# Patient Record
Sex: Female | Born: 1995 | Race: Black or African American | Hispanic: No | Marital: Single | Smoking: Never smoker
Health system: Southern US, Community
[De-identification: ages and names within clinical notes are randomized; demographics above are authoritative.]

## PROBLEM LIST (undated history)

## (undated) ENCOUNTER — Emergency Department: Admission: EM | Payer: Managed Care, Other (non HMO)

## (undated) DIAGNOSIS — T7840XA Allergy, unspecified, initial encounter: Secondary | ICD-10-CM

## (undated) DIAGNOSIS — I10 Essential (primary) hypertension: Secondary | ICD-10-CM

## (undated) DIAGNOSIS — A64 Unspecified sexually transmitted disease: Secondary | ICD-10-CM

## (undated) DIAGNOSIS — E119 Type 2 diabetes mellitus without complications: Secondary | ICD-10-CM

## (undated) DIAGNOSIS — B009 Herpesviral infection, unspecified: Secondary | ICD-10-CM

## (undated) HISTORY — PX: INCISE AND DRAIN ABCESS: PRO64

## (undated) HISTORY — PX: COLON SURGERY: SHX602

## (undated) HISTORY — DX: Unspecified sexually transmitted disease: A64

## (undated) HISTORY — DX: Herpesviral infection, unspecified: B00.9

## (undated) HISTORY — DX: Essential (primary) hypertension: I10

## (undated) HISTORY — DX: Type 2 diabetes mellitus without complications: E11.9

## (undated) HISTORY — DX: Allergy, unspecified, initial encounter: T78.40XA

---
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
## 2014-01-12 ENCOUNTER — Encounter: Payer: Self-pay | Admitting: Internal Medicine

## 2014-01-12 LAB — HM DIABETES EYE EXAM

## 2014-02-11 ENCOUNTER — Other Ambulatory Visit: Payer: Self-pay | Admitting: Internal Medicine

## 2014-02-12 ENCOUNTER — Other Ambulatory Visit: Payer: Self-pay | Admitting: Internal Medicine

## 2014-05-13 ENCOUNTER — Other Ambulatory Visit: Payer: Self-pay | Admitting: Internal Medicine

## 2014-12-09 ENCOUNTER — Ambulatory Visit (INDEPENDENT_AMBULATORY_CARE_PROVIDER_SITE_OTHER): Payer: BLUE CROSS/BLUE SHIELD | Admitting: Internal Medicine

## 2014-12-09 ENCOUNTER — Encounter: Payer: Self-pay | Admitting: Internal Medicine

## 2014-12-09 VITALS — BP 122/80 | HR 99 | Temp 98.6°F | Resp 12 | Wt 123.8 lb

## 2014-12-09 DIAGNOSIS — E109 Type 1 diabetes mellitus without complications: Secondary | ICD-10-CM | POA: Diagnosis not present

## 2014-12-09 NOTE — Progress Notes (Signed)
Patient ID: Brittany Dixon, female   DOB: [birth year], 19 y.o.   MRN: [id number]  HPI: Brittany Dixon is a 19 y.o.-year-old female returning for f/u for DM1, dx 2004 (19 y/o) uncontrolled, without complications. PCP - [facility] - [place], [place]. Last visit 2 mo ago.  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 She is not interested to restart on a pump.  Last hemoglobin A1c was: Lab Results  Component Value Date   HGBA1C 11.9* 10/05/2013  - 01/2013: 11.4% She was seeing Dr Griffin Dakin (?) - [place] for DM management as a child.   She is on: - Lantus 28 units at bedtime - pens - NovoLog - pens - takes it after she eats  ICR 1:7.5  target 125  ISF 50 She ends up injecting: 15-18 units per meal - injects long after a meal! Injects in abdomen mostly.  Pt checks her sugars 2-3x a day and they are: - am: 80-130 >> 80-120 - 2h after b'fast: n/c - before lunch: 225-250 >> 170-280 (180s if compliant with b'fast insulin) - 2h after lunch: n/c - takes NovoLog at 4 pm!, then goes to class; does not check sugars then, does not calculate carbs - before dinner (7 pm): 200-250 >> 120s if takes insulin with lunch; 300s if  if skips insulin with lunch - 2h after dinner: n/c - bedtime: 180-320 >> n/c - nighttime: n/c No lows. Lowest sugar was 64;  she has hypoglycemia awareness at 70. One previous hypoglycemia admission - in middle school. Has a glucagon kit at home - had to use it once, in 2002. Highest sugar was 423 >> HI. Had previous DKA admissions - last in 6th grade  Pt's meals are: - Breakfast: grits, eggs, sausage and bowl of fruit - in cafeteria - Lunch: hot dog or cereal + milk - Dinner: sandwich + greens - Snacks: no Drinks water or tea - sweet.   - no CKD, last BUN/creatinine: Lab Results  Component Value Date   BUN 10 10/05/2013   CREATININE 0.8 10/05/2013   MICROALBUMIN / CREATININE URINE RATIO      Result Value Ref Range   Microalb, Ur  1.0  0.0 - 1.9 mg/dL   Creatinine,U 112.8     Microalb Creat Ratio 0.9  0.0 - 30.0 mg/g   - lipids:  No results found for: CHOL, HDL, LDLCALC, LDLDIRECT, TRIG, CHOLHDL  - last eye exam was a year ago. No DR.   - no numbness and tingling in her feet.  Last TSH:  Lab Results  Component Value Date   TSH 0.90 10/05/2013   Started OCPs.  ROS: Constitutional: no weight gain/loss, no fatigue, no subjective hyperthermia/hypothermia Eyes: no blurry vision, no xerophthalmia ENT: no sore throat, no nodules palpated in throat, no dysphagia/odynophagia, no hoarseness Cardiovascular: no CP/SOB/palpitations/leg swelling Respiratory: no cough/SOB Gastrointestinal: no N/V/D/C Musculoskeletal: no muscle/joint aches Skin: no rashes Neurological: no tremors/numbness/tingling/dizziness  I reviewed pt's medications, allergies, PMH, social hx, family hx, and changes were documented in the history of present illness. Otherwise, unchanged from my initial visit note:  Past Medical History  Diagnosis Date  . Diabetes mellitus without complication   . Allergy     seasonal allergies   Past Surgical History  Procedure Laterality Date  . Colon surgery     History   Social History  . Marital Status: Single    Spouse Name: N/A    Number of  Children: 0   Occupational History  . student   Social History Main Topics  . Smoking status: Never Smoker   . Smokeless tobacco: No  . Alcohol Use: No  . Drug Use: No   Current Outpatient Rx  Name  Route  Sig  Dispense  Refill  . ACCU-CHEK AVIVA PLUS test strip      USE AS DIRECTED TO TEST BLOOD SUGAR 6-8 TIMES PER DAY.   300 each   2   . Insulin Glargine (LANTUS SOLOSTAR) 100 UNIT/ML Solostar Pen   Subcutaneous   Inject 28 Units into the skin daily at 10 pm.   15 pen   3   . NOVOLOG FLEXPEN 100 UNIT/ML FlexPen      INJECT SUBCUTANEOUSLY AS DIRECTED UP TO MAX DAILY DOSE OF 75 UNITS   30 mL   3    NKDA  Family History  Problem  Relation Age of Onset  . Diabetes Mother     Type I  . Lupus Mother   . Allergies Father   . Hypertension Maternal Grandmother    PE: BP 122/80 mmHg  Pulse 99  Temp(Src) 98.6 F (37 C) (Oral)  Resp 12  Wt 123 lb 12.8 oz (56.155 kg)  SpO2 97% Wt Readings from Last 3 Encounters:  12/09/14 123 lb 12.8 oz (56.155 kg) (41 %*, Z = -0.22)  10/05/13 124 lb (56.246 kg) (47 %*, Z = -0.08)   * Growth percentiles are based on CDC 2-20 Years data.   Constitutional: normal weight, in NAD Eyes: PERRLA, EOMI, no exophthalmos ENT: moist mucous membranes, no thyromegaly, no cervical lymphadenopathy Cardiovascular: tachycardia, RR, No MRG Respiratory: CTA B Gastrointestinal: abdomen soft, NT, ND, BS+ Musculoskeletal: no deformities, strength intact in all 4 Skin: moist, warm, no rashes Neurological: no tremor with outstretched hands, DTR normal in all 4  ASSESSMENT: 1. DM1, uncontrolled, without complications  PLAN:  1. Patient with long-standing, uncontrolled DM1, on insulin therapy. Not interested in starting on a pump. She is still not bolusing with all meals and bolusing after meals or occasionally w/o any relationship with the meal!!! I advised her to not do that anymore >> this can cause large fluctuations in sugars. She agrees to check sugars 4x a day, 10-15 min before meals and bolus at that time. We will increase her insulin with meals: decrease ICR and target. - I advised her to:  Patient Instructions  Please continue: - Lantus 28 units at bedtime   Please change: - NovoLog   ICR 1:7.5 >> 1:6  target 125 >> 120  ISF 50 121-170: + 1 unit 171-220: + 2 units 221-270: + 3 units >270: + 4 units  Please check sugars and inject NovoLog 10-15 min before a meal.  Do not skip insulin doses.  Please return in 2-3 weeks with your sugar log.   - continue checking sugars at different times of the day - check at least 3 times a day, rotating checks - advised for yearly eye exams   - Has a glucagon kit at home, parents know how to use it - advised to always have Glu tablets with her, esp. When driving - will need refer to DM education for further help with carb counting  - brings DMV form - I need to complete - no signs of other autoimmune disorders

## 2014-12-09 NOTE — Patient Instructions (Signed)
Please continue: - Lantus 28 units at bedtime   Please change: - NovoLog   ICR 1:7.5 >> 1:6  target 125 >> 120  ISF 50 121-170: + 1 unit 171-220: + 2 units 221-270: + 3 units >270: + 4 units  Please check sugars and inject NovoLog 10-15 min before a meal.  Do not skip insulin doses.  Please return in 2-3 week with your sugar log.

## 2014-12-20 ENCOUNTER — Encounter: Payer: Self-pay | Admitting: Internal Medicine

## 2014-12-20 LAB — HM DIABETES EYE EXAM

## 2015-01-06 ENCOUNTER — Ambulatory Visit: Payer: No Typology Code available for payment source | Admitting: Internal Medicine

## 2015-02-16 ENCOUNTER — Other Ambulatory Visit: Payer: Self-pay | Admitting: Internal Medicine

## 2015-04-06 ENCOUNTER — Other Ambulatory Visit: Payer: Self-pay | Admitting: *Deleted

## 2015-04-06 ENCOUNTER — Telehealth: Payer: Self-pay | Admitting: Internal Medicine

## 2015-04-06 MED ORDER — INSULIN GLARGINE 100 UNIT/ML SOLOSTAR PEN: 28.0000 [IU] | PEN_INJECTOR | Freq: Every day | SUBCUTANEOUS | Status: DC

## 2015-04-06 MED ORDER — INSULIN ASPART 100 UNIT/ML FLEXPEN: PEN_INJECTOR | SUBCUTANEOUS | Status: DC

## 2015-04-06 MED ORDER — GLUCOSE BLOOD VI STRP: ORAL_STRIP | Status: DC

## 2015-04-06 NOTE — Telephone Encounter (Signed)
Patient would like a new Rx sent to her new pharmacy   Rx: Lantus and novolog insulin  Accucheck meter strips   Pharmacy: [facility]  Fax: [phone]   Thank you

## 2015-09-01 ENCOUNTER — Telehealth: Payer: Self-pay | Admitting: Internal Medicine

## 2015-09-01 MED ORDER — INSULIN GLARGINE 100 UNIT/ML SOLOSTAR PEN: 28.0000 [IU] | PEN_INJECTOR | Freq: Every day | SUBCUTANEOUS | Status: DC

## 2015-09-01 MED ORDER — GLUCOSE BLOOD VI STRP: ORAL_STRIP | Status: DC

## 2015-09-01 MED ORDER — INSULIN ASPART 100 UNIT/ML FLEXPEN: PEN_INJECTOR | SUBCUTANEOUS | Status: DC

## 2015-09-01 NOTE — Telephone Encounter (Signed)
Patient need a refill of Accu check Aviva, Novalog and Lantus send to  [facility] - [place], [place] - [address] [phone] (Phone) [phone] (Fax)

## 2015-09-04 ENCOUNTER — Telehealth: Payer: Self-pay | Admitting: Internal Medicine

## 2015-09-04 NOTE — Telephone Encounter (Signed)
Called Brittany Dixon back and clarified dosage. She voiced understanding.

## 2015-09-04 NOTE — Telephone Encounter (Signed)
Brittany Dixon from [facility] pharmacy called and would like to clarify her refills    Please advise    Call back: [phone]

## 2016-02-23 ENCOUNTER — Telehealth: Payer: Self-pay | Admitting: Internal Medicine

## 2016-02-23 ENCOUNTER — Other Ambulatory Visit: Payer: Self-pay

## 2016-02-23 MED ORDER — INSULIN GLARGINE 100 UNIT/ML SOLOSTAR PEN: 28.0000 [IU] | PEN_INJECTOR | Freq: Every day | SUBCUTANEOUS | Status: AC

## 2016-02-23 MED ORDER — INSULIN ASPART 100 UNIT/ML FLEXPEN: PEN_INJECTOR | SUBCUTANEOUS | Status: AC

## 2016-02-23 NOTE — Telephone Encounter (Signed)
Pt needs lantus and novolog called to the [employer] [facility]

## 2016-02-23 NOTE — Telephone Encounter (Signed)
[facility] needs verification on insulin script that was sent in.  CB# [phone]

## 2016-08-05 DIAGNOSIS — A64 Unspecified sexually transmitted disease: Secondary | ICD-10-CM

## 2016-08-05 HISTORY — DX: Unspecified sexually transmitted disease: A64

## 2018-07-13 ENCOUNTER — Encounter (HOSPITAL_COMMUNITY): Payer: Self-pay | Admitting: Obstetrics and Gynecology

## 2018-08-13 ENCOUNTER — Encounter: Payer: Self-pay | Admitting: Obstetrics and Gynecology

## 2018-08-26 ENCOUNTER — Ambulatory Visit: Payer: Managed Care, Other (non HMO) | Admitting: Obstetrics and Gynecology

## 2018-08-26 ENCOUNTER — Encounter: Payer: Self-pay | Admitting: Obstetrics and Gynecology

## 2018-08-26 ENCOUNTER — Other Ambulatory Visit: Payer: Self-pay

## 2018-08-26 VITALS — BP 140/98 | HR 100 | Resp 16 | Ht 59.0 in | Wt 142.0 lb

## 2018-08-26 DIAGNOSIS — N766 Ulceration of vulva: Secondary | ICD-10-CM

## 2018-08-26 DIAGNOSIS — N760 Acute vaginitis: Secondary | ICD-10-CM

## 2018-08-26 DIAGNOSIS — Z113 Encounter for screening for infections with a predominantly sexual mode of transmission: Secondary | ICD-10-CM

## 2018-08-26 DIAGNOSIS — Z01419 Encounter for gynecological examination (general) (routine) without abnormal findings: Secondary | ICD-10-CM | POA: Diagnosis not present

## 2018-08-26 MED ORDER — NYSTATIN-TRIAMCINOLONE 100000-0.1 UNIT/GM-% EX CREA: 1.0000 "application " | TOPICAL_CREAM | Freq: Two times a day (BID) | CUTANEOUS | 0 refills | Status: DC

## 2018-08-26 MED ORDER — NORETHINDRONE 0.35 MG PO TABS: 1.0000 | ORAL_TABLET | Freq: Every day | ORAL | 3 refills | Status: DC

## 2018-08-26 NOTE — Patient Instructions (Signed)

## 2018-08-26 NOTE — Progress Notes (Signed)
23 y.o. G0P0000 Single African American female here for annual exam.    Wants to discuss birth control--OCPs Used LoLoestrin and she had cramping.   Took it for only one week.  Tired Erin and did well with this.  Able to take pills regularly and this is her preference.   No menstrual irregularlity, cramping, or heavy bleeding.  Menses last 6 - 7 days.   DM since elementary school. A1C 9.9.  Denies hx of HTN but her BP is elevated today.  Her partner tested for genital herpes, that she thinks is type 2.  She did blood work in beginning of December, and she tested negative.  No known outbreak.  [facility], which was negative.  She would like full testing today.   Works for [employer]. 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   PCP:  None. Dr. Romeo AppleHarrison:  Endocrinology.   Patient's last menstrual period was 08/05/2018 (exact date).       Sexually active: Yes.    The current method of family planning is condoms most of the time.    Exercising: No.  The patient does not participate in regular exercise at present. Smoker:  no  Health Maintenance: Pap: 07/2016 normal per patient History of abnormal Pap:  no MMG:  n/a Colonoscopy:  n/a BMD:   n/a  Result  n/a TDaP:  Unsure.  Thinks she had done in college.  Gardasil:   yes HIV: Neg per patient Hep C: Neg per patient Screening Labs:  --- Flu vaccine:  Not done.    reports that she has never smoked. She has never used smokeless tobacco. She reports previous alcohol use. She reports that she does not use drugs.  Past Medical History:  Diagnosis Date  . Allergy    seasonal allergies  . Diabetes mellitus without complication (HCC)   . STD (sexually transmitted disease) 2018   Tx'd for Chlamydia    Past Surgical History:  Procedure Laterality Date  . COLON SURGERY     as an infant    Current Outpatient Medications  Medication Sig Dispense Refill  . glucose blood (ACCU-CHEK AVIVA  PLUS) test strip USE AS DIRECTED TO TEST BLOOD SUGAR 6-8 TIMES PER DAY. **PT NEEDS APPT** 300 each 0  . insulin aspart (NOVOLOG FLEXPEN) 100 UNIT/ML FlexPen INJECT SUBCUTANEOUSLY AS DIRECTED UP TO MAX DAILY DOSE OF 75 UNITS **PT NEEDS FOLLOW UP APPT WITH DR GHERGHE** 10 pen 0  . Insulin Glargine (LANTUS SOLOSTAR) 100 UNIT/ML Solostar Pen Inject 28 Units into the skin daily at 10 pm. **PT NEEDS FOLLOW UP APPT WITH DR GHERGHE** 10 pen 0  . norethindrone-ethinyl estradiol (JUNEL FE,GILDESS FE,LOESTRIN FE) 1-20 MG-MCG tablet Take 1 tablet by mouth daily.     No current facility-administered medications for this visit.     Family History  Problem Relation Age of Onset  . Diabetes Mother        Type I  . Lupus Mother   . Allergies Father   . Hypertension Maternal Grandmother   . Stroke Maternal Grandmother   . Diabetes Maternal Grandfather   . Ovarian cancer Maternal Aunt     Review of Systems  All other systems reviewed and are negative.   Exam:   BP (!) 140/98 (BP Location: Right Arm, Patient Position: Sitting, Cuff Size: Normal)   Pulse 100   Resp 16   Ht 4\' 11"  (1.499 m)   Wt 142 lb (64.4 kg)   LMP  08/05/2018 (Exact Date)   BMI 28.68 kg/m     General appearance: alert, cooperative and appears stated age Head: Normocephalic, without obvious abnormality, atraumatic Neck: no adenopathy, supple, symmetrical, trachea midline and thyroid normal to inspection and palpation Lungs: clear to auscultation bilaterally Breasts: normal appearance, no masses or tenderness, No nipple retraction or dimpling, No nipple discharge or bleeding, No axillary or supraclavicular adenopathy Heart: regular rate and rhythm Abdomen: scars noted.  Abdomen is soft, non-tender; no masses, no organomegaly Extremities: extremities normal, atraumatic, no cyanosis or edema Skin: Skin color, texture, turgor normal. No rashes or lesions Lymph nodes: Cervical, supraclavicular, and axillary nodes normal. No  abnormal inguinal nodes palpated Neurologic: Grossly normal  Pelvic: External genitalia:   Grey coloration of the vulva.  Micro-ulceration of the right inferior labia majora. Nontender.               Urethra:  normal appearing urethra with no masses, tenderness or lesions              Bartholins and Skenes: normal                 Vagina: normal appearing vagina with normal color and discharge, no lesions              Cervix: no lesions              Pap taken: No. Bimanual Exam:  Uterus:  normal size, contour, position, consistency, mobility, non-tender              Adnexa: no mass, fullness, tenderness        Chaperone was present for exam.  Assessment:   Well woman visit with normal exam. DM. Uncontrolled.  Elevated blood pressure.  Hx colon surgery as infant.  Partner with genital HSV.  Vulvovaginitis.  Right labial ulceration.  Hx chlamydia.  Maternal aunt with ovarian cancer.  Plan: Mammogram screening. Recommended self breast awareness. Pap and HR HPV as above. Guidelines for Calcium, Vitamin D, regular exercise program including cardiovascular and weight bearing exercise. STD testing including HSV I and II IgM and IgG.  Right vulva HSV testing.  Rx Mycolog II. Restart Micronor. 3 packs and 3 refills.  Follow up annually and prn.   After visit summary provided.

## 2018-08-27 LAB — HSV(HERPES SIMPLEX VRS) I + II AB-IGG
HSV 1 Glycoprotein G Ab, IgG: <0.91 index (ref 0.00–0.90)
HSV 2 IgG, Type Spec: <0.91 index (ref 0.00–0.90)

## 2018-08-27 LAB — HEP, RPR, HIV PANEL
HIV Screen 4th Generation wRfx: NONREACTIVE
Hepatitis B Surface Ag: NEGATIVE
RPR: NONREACTIVE

## 2018-08-27 LAB — HEPATITIS C ANTIBODY

## 2018-08-27 LAB — HSV(HERPES SIMPLEX VRS) I + II AB-IGM

## 2018-08-28 LAB — NUSWAB VAGINITIS PLUS (VG+)
CANDIDA ALBICANS, NAA: NEGATIVE
CANDIDA GLABRATA, NAA: NEGATIVE
CHLAMYDIA TRACHOMATIS, NAA: NEGATIVE
Neisseria gonorrhoeae, NAA: NEGATIVE
Trich vag by NAA: NEGATIVE

## 2018-09-01 LAB — HSV NAA
HSV 1 NAA: NEGATIVE
HSV 2 NAA: NEGATIVE

## 2018-09-02 ENCOUNTER — Telehealth: Payer: Self-pay

## 2018-09-02 MED ORDER — METRONIDAZOLE 500 MG PO TABS: 500.0000 mg | ORAL_TABLET | Freq: Two times a day (BID) | ORAL | 0 refills | Status: DC

## 2018-09-02 NOTE — Telephone Encounter (Signed)
-----   Message from Patton Salles, MD sent at 08/31/2018  8:40 AM EST ----- Please report results to patient.  Please inform of result showing bacterial vaginosis. She may treat with Flagyl 500 mg po bid for 7 days or Metrogel pv at hs for 5 nights.  Please send Rx to pharmacy of choice. ETOH precautions.   The blood work is negative for HIV, syphilis, hepatitis B and C, and herpes type I and II.  Her vulvar swab for herpes is still in process.   Testing is negative for gonorrhea and chlamydia.   I am highlighting this result so you know to contact the patient.

## 2018-09-02 NOTE — Telephone Encounter (Signed)
Spoke with patient and reviewed all lab results per Dr.Silva's note below. Will send Rx. For Flagyl 500mg  #14, 1 po bid, 0R [facility] per patient's instructions.

## 2018-09-07 ENCOUNTER — Telehealth: Payer: Self-pay

## 2018-09-07 NOTE — Telephone Encounter (Signed)
Called patient and left message for her to return my call. 

## 2018-09-07 NOTE — Telephone Encounter (Signed)
-----   Message from Patton Salles, MD sent at 09/02/2018  2:39 PM EST ----- Results to patient through My Chart.  Hello Brittany Dixon,   I am sharing your test results from your recent visit.  Your testing is negative for herpes of the vulva.   Please contact the office for any questions.   Have a good evening!  Conley Simmonds, MD

## 2018-09-09 ENCOUNTER — Ambulatory Visit (INDEPENDENT_AMBULATORY_CARE_PROVIDER_SITE_OTHER): Payer: Managed Care, Other (non HMO) | Admitting: Obstetrics and Gynecology

## 2018-09-09 ENCOUNTER — Other Ambulatory Visit: Payer: Self-pay

## 2018-09-09 ENCOUNTER — Encounter: Payer: Self-pay | Admitting: Obstetrics and Gynecology

## 2018-09-09 VITALS — BP 120/72 | HR 64 | Ht 59.0 in | Wt 143.0 lb

## 2018-09-09 DIAGNOSIS — N760 Acute vaginitis: Secondary | ICD-10-CM | POA: Diagnosis not present

## 2018-09-09 MED ORDER — FLUCONAZOLE 150 MG PO TABS: 150.0000 mg | ORAL_TABLET | Freq: Once | ORAL | 0 refills | Status: AC

## 2018-09-09 NOTE — Telephone Encounter (Signed)
Patient notified of all results at appointment today.

## 2018-09-09 NOTE — Patient Instructions (Signed)
Vaginitis  Vaginitis is a condition in which the vaginal tissue swells and becomes red (inflamed). This condition is most often caused by a change in the normal balance of bacteria and yeast that live in the vagina. This change causes an overgrowth of certain bacteria or yeast, which causes the inflammation. There are different types of vaginitis, but the most common types are:   Bacterial vaginosis.   Yeast infection (candidiasis).   Trichomoniasis vaginitis. This is a sexually transmitted disease (STD).   Viral vaginitis.   Atrophic vaginitis.   Allergic vaginitis.  What are the causes?  The cause of this condition depends on the type of vaginitis. It can be caused by:   Bacteria (bacterial vaginosis).   Yeast, which is a fungus (yeast infection).   A parasite (trichomoniasis vaginitis).   A virus (viral vaginitis).   Low hormone levels (atrophic vaginitis). Low hormone levels can occur during pregnancy, breastfeeding, or after menopause.   Irritants, such as bubble baths, scented tampons, and feminine sprays (allergic vaginitis).  Other factors can change the normal balance of the yeast and bacteria that live in the vagina. These include:   Antibiotic medicines.   Poor hygiene.   Diaphragms, vaginal sponges, spermicides, birth control pills, and intrauterine devices (IUD).   Sex.   Infection.   Uncontrolled diabetes.   A weakened defense (immune) system.  What increases the risk?  This condition is more likely to develop in women who:   Smoke.   Use vaginal douches, scented tampons, or scented sanitary pads.   Wear tight-fitting pants.   Wear thong underwear.   Use oral birth control pills or an IUD.   Have sex without a condom.   Have multiple sex partners.   Have an STD.   Frequently use the spermicide nonoxynol-9.   Eat lots of foods high in sugar.   Have uncontrolled diabetes.   Have low estrogen levels.   Have a weakened immune system from an immune disorder or medical  treatment.   Are pregnant or breastfeeding.  What are the signs or symptoms?  Symptoms vary depending on the cause of the vaginitis. Common symptoms include:   Abnormal vaginal discharge.  ? The discharge is white, gray, or yellow with bacterial vaginosis.  ? The discharge is thick, white, and cheesy with a yeast infection.  ? The discharge is frothy and yellow or greenish with trichomoniasis.   A bad vaginal smell. The smell is fishy with bacterial vaginosis.   Vaginal itching, pain, or swelling.   Sex that is painful.   Pain or burning when urinating.  Sometimes there are no symptoms.  How is this diagnosed?  This condition is diagnosed based on your symptoms and medical history. A physical exam, including a pelvic exam, will also be done. You may also have other tests, including:   Tests to determine the pH level (acidity or alkalinity) of your vagina.   A whiff test, to assess the odor that results when a sample of your vaginal discharge is mixed with a potassium hydroxide solution.   Tests of vaginal fluid. A sample will be examined under a microscope.  How is this treated?  Treatment varies depending on the type of vaginitis you have. Your treatment may include:   Antibiotic creams or pills to treat bacterial vaginosis and trichomoniasis.   Antifungal medicines, such as vaginal creams or suppositories, to treat a yeast infection.   Medicine to ease discomfort if you have viral vaginitis. Your sexual partner   should also be treated.   Estrogen delivered in a cream, pill, suppository, or vaginal ring to treat atrophic vaginitis. If vaginal dryness occurs, lubricants and moisturizing creams may help. You may need to avoid scented soaps, sprays, or douches.   Stopping use of a product that is causing allergic vaginitis. Then using a vaginal cream to treat the symptoms.  Follow these instructions at home:  Lifestyle   Keep your genital area clean and dry. Avoid soap, and only rinse the area with  water.   Do not douche or use tampons until your health care provider says it is okay to do so. Use sanitary pads, if needed.   Do not have sex until your health care provider approves. When you can return to sex, practice safe sex and use condoms.   Wipe from front to back. This avoids the spread of bacteria from the rectum to the vagina.  General instructions   Take over-the-counter and prescription medicines only as told by your health care provider.   If you were prescribed an antibiotic medicine, take or use it as told by your health care provider. Do not stop taking or using the antibiotic even if you start to feel better.   Keep all follow-up visits as told by your health care provider. This is important.  How is this prevented?   Use mild, non-scented products. Do not use things that can irritate the vagina, such as fabric softeners. Avoid the following products if they are scented:  ? Feminine sprays.  ? Detergents.  ? Tampons.  ? Feminine hygiene products.  ? Soaps or bubble baths.   Let air reach your genital area.  ? Wear cotton underwear to reduce moisture buildup.  ? Avoid wearing underwear while you sleep.  ? Avoid wearing tight pants and underwear or nylons without a cotton panel.  ? Avoid wearing thong underwear.   Take off any wet clothing, such as bathing suits, as soon as possible.   Practice safe sex and use condoms.  Contact a health care provider if:   You have abdominal pain.   You have a fever.   You have symptoms that last for more than 2-3 days.  Get help right away if:   You have a fever and your symptoms suddenly get worse.  Summary   Vaginitis is a condition in which the vaginal tissue becomes inflamed.This condition is most often caused by a change in the normal balance of bacteria and yeast that live in the vagina.   Treatment varies depending on the type of vaginitis you have.   Do not douche, use tampons , or have sex until your health care provider approves. When  you can return to sex, practice safe sex and use condoms.  This information is not intended to replace advice given to you by your health care provider. Make sure you discuss any questions you have with your health care provider.  Document Released: 05/19/2007 Document Revised: 08/27/2016 Document Reviewed: 08/27/2016  Elsevier Interactive Patient Education  2019 [employer]

## 2018-09-09 NOTE — Progress Notes (Signed)
GYNECOLOGY  VISIT   HPI: 23 y.o.   Single  African American  female   G0P0000 with Patient's last menstrual period was 08/05/2018.   here for follow up.    Thinks she may have another yeast infection.  Having itching.  No odor or discharge.  Has DM.   Treated for yeast vulvitis at her office visit on 08/26/18. Received Rx for Mycolog.  Not a big difference per patient.   She had negative STD testing including HSV I and II.   She was treated for BV with Flagyl and finished this course of abx on 3 days ago.   Shaves her vulva.   Partner has HSV. Asking about follow up for her about this.   GYNECOLOGIC HISTORY: Patient's last menstrual period was 08/05/2018. Contraception: Condoms when SA Menopausal hormone therapy:  n/a Last mammogram:  n/a Last pap smear:  07/2016 normal per patient        OB History    Gravida  0   Para  0   Term  0   Preterm  0   AB  0   Living  0     SAB  0   TAB  0   Ectopic  0   Multiple  0   Live Births  0              Patient Active Problem List   Diagnosis Date Noted  . Type 1 diabetes mellitus not at goal [facility](HCC) 10/05/2013    Past Medical History:  Diagnosis Date  . Allergy    seasonal allergies  . Diabetes mellitus without complication (HCC)   . STD (sexually transmitted disease) 2018   Tx'd for Chlamydia    Past Surgical History:  Procedure Laterality Date  . COLON SURGERY     as an infant    Current Outpatient Medications  Medication Sig Dispense Refill  . glucose blood (ACCU-CHEK AVIVA PLUS) test strip USE AS DIRECTED TO TEST BLOOD SUGAR 6-8 TIMES PER DAY. **PT NEEDS APPT** 300 each 0  . insulin aspart (NOVOLOG FLEXPEN) 100 UNIT/ML FlexPen INJECT SUBCUTANEOUSLY AS DIRECTED UP TO MAX DAILY DOSE OF 75 UNITS **PT NEEDS FOLLOW UP APPT WITH DR GHERGHE** 10 pen 0  . Insulin Glargine (LANTUS SOLOSTAR) 100 UNIT/ML Solostar Pen Inject 28 Units into the skin daily at 10 pm. **PT NEEDS FOLLOW UP APPT WITH DR  GHERGHE** 10 pen 0  . norethindrone (MICRONOR,CAMILA,ERRIN) 0.35 MG tablet Take 1 tablet (0.35 mg total) by mouth daily. 3 Package 3  . nystatin-triamcinolone (MYCOLOG II) cream Apply 1 application topically 2 (two) times daily. Apply to affected area BID for up to 7 days. 60 g 0   No current facility-administered medications for this visit.      ALLERGIES: Patient has no known allergies.  Family History  Problem Relation Age of Onset  . Diabetes Mother        Type I  . Lupus Mother   . Allergies Father   . Hypertension Maternal Grandmother   . Stroke Maternal Grandmother   . Diabetes Maternal Grandfather   . Ovarian cancer Maternal Aunt     Social History   Socioeconomic History  . Marital status: Single    Spouse name: Not on file  . Number of children: Not on file  . Years of education: Not on file  . Highest education level: Not on file  Occupational History  . Not on file  Social Needs  . Physicist, medicalinancial resource  strain: Not on file  . Food insecurity:    Worry: Not on file    Inability: Not on file  . Transportation needs:    Medical: Not on file    Non-medical: Not on file  Tobacco Use  . Smoking status: Never Smoker  . Smokeless tobacco: Never Used  Substance and Sexual Activity  . Alcohol use: Not Currently    Frequency: Never  . Drug use: Never  . Sexual activity: Yes    Birth control/protection: Condom  Lifestyle  . Physical activity:    Days per week: Not on file    Minutes per session: Not on file  . Stress: Not on file  Relationships  . Social connections:    Talks on phone: Not on file    Gets together: Not on file    Attends religious service: Not on file    Active member of club or organization: Not on file    Attends meetings of clubs or organizations: Not on file    Relationship status: Not on file  . Intimate partner violence:    Fear of current or ex partner: Not on file    Emotionally abused: Not on file    Physically abused: Not on file     Forced sexual activity: Not on file  Other Topics Concern  . Not on file  Social History Narrative  . Not on file    Review of Systems  All other systems reviewed and are negative.   PHYSICAL EXAMINATION:    BP 120/72 (BP Location: Right Arm, Patient Position: Sitting, Cuff Size: Normal)   Pulse 64   Ht 4\' 11"  (1.499 m)   Wt 143 lb (64.9 kg)   LMP 08/05/2018   BMI 28.88 kg/m     General appearance: alert, cooperative and appears stated age   Pelvic: External genitalia:  no lesions.  Follicular irritation.  Grey coloration of the vulva and generalized whitish change to the perineum.               Urethra:  normal appearing urethra with no masses, tenderness or lesions              Bartholins and Skenes: normal                 Vagina: normal appearing vagina with normal color and discharge, no lesions              Cervix: no lesions                Bimanual Exam:  Uterus:  normal size, contour, position, consistency, mobility, non-tender              Adnexa: no mass, fullness, tenderness             Chaperone was present for exam.  ASSESSMENT  Vulvovaginitis. Looks like continued candida today. Recent tx for BV with Flagyl. Partner with HSV.  Patient tested negative.  DM.   PLAN  Affirm.  Will treat with Diflucan 150 mg po x 1.  May repeat in 72 hours prn.  I encouraged good blood sugar control and probiotics as a way to reduce recurrent yeast infection.  She will use a clean razor with shaving of the vulva. I recommend condom use.  We discussed asymptomatic shedding of viral particles.  I did discuss yearly STD testing which can include serum screening for HSV I and II.      An After Visit Summary was printed  and given to the patient.  ___15___ minutes face to face time of which over 50% was spent in counseling.

## 2018-09-10 LAB — VAGINITIS/VAGINOSIS, DNA PROBE
Candida Species: NEGATIVE
Gardnerella vaginalis: NEGATIVE
Trichomonas vaginosis: NEGATIVE

## 2018-11-27 ENCOUNTER — Other Ambulatory Visit: Payer: Self-pay

## 2018-11-27 ENCOUNTER — Ambulatory Visit (HOSPITAL_COMMUNITY)
Admission: EM | Admit: 2018-11-27 | Discharge: 2018-11-27 | Disposition: A | Discharge status: Home / Self Care | Payer: Managed Care, Other (non HMO) | Attending: Family Medicine | Admitting: Family Medicine

## 2018-11-27 ENCOUNTER — Encounter (HOSPITAL_COMMUNITY): Payer: Self-pay | Admitting: Emergency Medicine

## 2018-11-27 DIAGNOSIS — N898 Other specified noninflammatory disorders of vagina: Secondary | ICD-10-CM | POA: Insufficient documentation

## 2018-11-27 NOTE — ED Provider Notes (Signed)
[facility]    CSN: [id number] Arrival date & time: 11/27/18  0920     History   Chief Complaint Chief Complaint  Patient presents with  . Rash    HPI Brittany Dixon is a 23 y.o. female.   Patient is a 23 year old female who presents today with irritated area to vaginal area.  Describes as mild rash.  Denies any pain or itching.  Symptoms have been constant.  She does shave the vaginal area.  Reports unprotected sex on Tuesday.  She is here with concerns for possible STDs.  Reports the partner she encountered with has tested positive for HSV.  Denies any associated vaginal discharge, dysuria, hematuria, pelvic pain, abdominal pain.  No fevers. Patient's last menstrual period was 11/17/2018.  ROS per HPI      Past Medical History:  Diagnosis Date  . Allergy    seasonal allergies  . Diabetes mellitus without complication (HCC)   . STD (sexually transmitted disease) 2018   Tx'd for Chlamydia    Patient Active Problem List   Diagnosis Date Noted  . Type 1 diabetes mellitus not at goal [facility](HCC) 10/05/2013    Past Surgical History:  Procedure Laterality Date  . COLON SURGERY     as an infant    OB History    Gravida  0   Para  0   Term  0   Preterm  0   AB  0   Living  0     SAB  0   TAB  0   Ectopic  0   Multiple  0   Live Births  0            Home Medications    Prior to Admission medications   Medication Sig Start Date End Date Taking? Authorizing Provider  glucose blood (ACCU-CHEK AVIVA PLUS) test strip USE AS DIRECTED TO TEST BLOOD SUGAR 6-8 TIMES PER DAY. **PT NEEDS APPT** 09/01/15   Carlus PavlovGherghe, Cristina, MD  insulin aspart (NOVOLOG FLEXPEN) 100 UNIT/ML FlexPen INJECT SUBCUTANEOUSLY AS DIRECTED UP TO MAX DAILY DOSE OF 75 UNITS **PT NEEDS FOLLOW UP APPT WITH DR Elvera LennoxGHERGHE** 02/23/16   Carlus PavlovGherghe, Cristina, MD  Insulin Glargine (LANTUS SOLOSTAR) 100 UNIT/ML Solostar Pen Inject 28 Units into the skin daily at 10 pm. **PT NEEDS FOLLOW UP  APPT WITH DR Elvera LennoxGHERGHE** 02/23/16   Carlus PavlovGherghe, Cristina, MD  norethindrone (MICRONOR,CAMILA,ERRIN) 0.35 MG tablet Take 1 tablet (0.35 mg total) by mouth daily. 08/26/18   Patton SallesAmundson C Silva, Brook E, MD  nystatin-triamcinolone (MYCOLOG II) cream Apply 1 application topically 2 (two) times daily. Apply to affected area BID for up to 7 days. 08/26/18   Patton SallesAmundson C Silva, Brook E, MD    Family History Family History  Problem Relation Age of Onset  . Diabetes Mother        Type I  . Lupus Mother   . Allergies Father   . Hypertension Maternal Grandmother   . Stroke Maternal Grandmother   . Diabetes Maternal Grandfather   . Ovarian cancer Maternal Aunt     Social History Social History   Tobacco Use  . Smoking status: Never Smoker  . Smokeless tobacco: Never Used  Substance Use Topics  . Alcohol use: Not Currently    Frequency: Never  . Drug use: Never     Allergies   Patient has no known allergies.   Review of Systems Review of Systems   Physical Exam Triage Vital Signs ED Triage Vitals [11/27/18  0937]  Enc Vitals Group     BP (!) 148/92     Pulse Rate 99     Resp 18     Temp 98.7 F (37.1 C)     Temp Source Oral     SpO2 100 %     Weight      Height      Head Circumference      Peak Flow      Pain Score 0     Pain Loc      Pain Edu?      Excl. in GC?    No data found.  Updated Vital Signs BP (!) 148/92 (BP Location: Right Arm)   Pulse 99   Temp 98.7 F (37.1 C) (Oral)   Resp 18   LMP 11/17/2018   SpO2 100%   Visual Acuity Right Eye Distance:   Left Eye Distance:   Bilateral Distance:    Right Eye Near:   Left Eye Near:    Bilateral Near:     Physical Exam Vitals signs and nursing note reviewed.  Constitutional:      General: She is not in acute distress.    Appearance: Normal appearance. She is not ill-appearing, toxic-appearing or diaphoretic.  HENT:     Head: Normocephalic.     Nose: Nose normal.     Mouth/Throat:     Pharynx: Oropharynx is  clear.  Eyes:     Conjunctiva/sclera: Conjunctivae normal.  Neck:     Musculoskeletal: Normal range of motion.  Pulmonary:     Effort: Pulmonary effort is normal.  Abdominal:     Palpations: Abdomen is soft.     Tenderness: There is no abdominal tenderness.  Genitourinary:    Comments: Mild redness and irritation to labia majora No obvious rash,bumps,  specifically vesicles. Non tender to touch.  Mild erythema to labia minora with discharge at vaginal opening.   Speculum exam deferred.  Musculoskeletal: Normal range of motion.  Skin:    General: Skin is warm and dry.     Findings: No rash.  Neurological:     Mental Status: She is alert.  Psychiatric:        Mood and Affect: Mood normal.      UC Treatments / Results  Labs (all labs ordered are listed, but only abnormal results are displayed) Labs Reviewed  HSV CULTURE AND TYPING  CERVICOVAGINAL ANCILLARY ONLY    EKG None  Radiology No results found.  Procedures Procedures (including critical care time)  Medications Ordered in UC Medications - No data to display  Initial Impression / Assessment and Plan / UC Course  I have reviewed the triage vital signs and the nursing notes.  Pertinent labs & imaging results that were available during my care of the patient were reviewed by me and considered in my medical decision making (see chart for details).    Vaginal irritation  Not convinced this is HSV.  Swab sent for testing  Sent second swab for further STD testing.  Labs pending.  Instructed the pt that we will call with any positive  results.  Final Clinical Impressions(s) / UC Diagnoses   Final diagnoses:  Vaginal irritation     Discharge Instructions     We are sending the swabs for testing We will call you with any positive results and treat as needed.  Follow up as needed for continued or worsening symptoms      ED Prescriptions    None  Controlled Substance Prescriptions [place]  Controlled Substance Registry consulted? Not Applicable   Janace Aris, NP 11/27/18 1051

## 2018-11-27 NOTE — Discharge Instructions (Addendum)
We are sending the swabs for testing We will call you with any positive results and treat as needed.  Follow up as needed for continued or worsening symptoms

## 2018-11-27 NOTE — ED Notes (Signed)
Patient able to ambulate independently  

## 2018-11-27 NOTE — ED Triage Notes (Signed)
Pt presents to [facility] for assessment of irritation to vaginal area.  Pt is sexually active.  Pt does not currently use BC.  LMP 4/14

## 2018-11-30 LAB — CERVICOVAGINAL ANCILLARY ONLY
Bacterial vaginitis: NEGATIVE
Candida vaginitis: POSITIVE — AB
Chlamydia: NEGATIVE
Neisseria Gonorrhea: NEGATIVE
Trichomonas: NEGATIVE

## 2018-12-01 LAB — HSV CULTURE AND TYPING

## 2018-12-03 ENCOUNTER — Telehealth (HOSPITAL_COMMUNITY): Payer: Self-pay | Admitting: Emergency Medicine

## 2018-12-03 MED ORDER — FLUCONAZOLE 150 MG PO TABS: 150.0000 mg | ORAL_TABLET | Freq: Once | ORAL | 0 refills | Status: AC

## 2018-12-03 NOTE — Telephone Encounter (Signed)
Test for candida (yeast) was positive.  Prescription for fluconazole 150mg po now, repeat dose in 3d if needed, #2 no refills, sent to the pharmacy of record.  Recheck or followup with PCP for further evaluation if symptoms are not improving.    Patient contacted and made aware of all results, all questions answered.   

## 2019-03-11 ENCOUNTER — Other Ambulatory Visit: Payer: Self-pay

## 2019-03-12 NOTE — Progress Notes (Signed)
23 y.o. Single African American female G0P0000 here with complaint of vaginal symptoms of increase discharge. Describes odorous discharge. Onset of symptoms 7 days ago. Denies new personal products or vaginal dryness. Has STD concerns and would like screening.Marland Kitchen Urinary symptoms none.  Contraception is condoms sporadic, not with this partner. Patient is Type 1 Diabetic, good control per patient.. Contraception has been POP and stopped 2 months ago due to generic that worked well for her not available. No other health issues today.  Review of Systems  Constitutional: Negative.   HENT: Negative.   Eyes: Negative.   Respiratory: Negative.   Cardiovascular: Negative.   Gastrointestinal: Negative.   Genitourinary: Negative.  Negative for hematuria.  Musculoskeletal: Negative.   Skin:       Vaginal discharge with odor  Neurological: Negative.   Endo/Heme/Allergies: Negative.   Psychiatric/Behavioral: Negative.     O:Healthy female WDWN Affect: normal, orientation x 3  Exam:Skin warm and dry Abdomen:soft non tender, no rebound  Inguinal Lymph nodes: no enlargement or tenderness Pelvic exam: External genital: normal female BUS: negative Vagina: blood present(menses) no other discharge noted.  Affirm taken, GC/Chlamydia obtained Cervix: normal, non tender, no CMT Uterus: normal, non tender Adnexa:normal, non tender, no masses or fullness noted   A:Normal pelvic exam Contraception none, has Rx for POP  (generic not working well) R/O vaginal infection STD screening   P:Discussed findings of menses blood and no other vaginal odor noted. Discussed will do screening and treat as indicated. She will be called with results. Discussed STD prevention pregnancy with condom use. Patient plans to refill POP and ask for generic that worked the best for her. She is aware to start with the period. Questions addressed. She will call if has concerns or problems with POP. Labs: Affirm, Gc/Chlamydia,  HIV, RPR, Hep C  Rv prn, as above

## 2019-03-15 ENCOUNTER — Other Ambulatory Visit: Payer: Self-pay

## 2019-03-15 ENCOUNTER — Encounter: Payer: Self-pay | Admitting: Certified Nurse Midwife

## 2019-03-15 ENCOUNTER — Ambulatory Visit: Payer: Managed Care, Other (non HMO) | Admitting: Certified Nurse Midwife

## 2019-03-15 VITALS — BP 118/62 | HR 68 | Temp 97.2°F | Resp 16 | Wt 142.0 lb

## 2019-03-15 DIAGNOSIS — N898 Other specified noninflammatory disorders of vagina: Secondary | ICD-10-CM | POA: Diagnosis not present

## 2019-03-15 DIAGNOSIS — Z113 Encounter for screening for infections with a predominantly sexual mode of transmission: Secondary | ICD-10-CM

## 2019-03-16 LAB — RPR: RPR Ser Ql: NONREACTIVE

## 2019-03-16 LAB — HIV ANTIBODY (ROUTINE TESTING W REFLEX): HIV Screen 4th Generation wRfx: NONREACTIVE

## 2019-03-16 LAB — HEPATITIS C ANTIBODY: Hep C Virus Ab: <0.1 s/co ratio (ref 0.0–0.9)

## 2019-03-17 LAB — VAGINITIS/VAGINOSIS, DNA PROBE
Candida Species: NEGATIVE
Gardnerella vaginalis: POSITIVE — AB
Trichomonas vaginosis: NEGATIVE

## 2019-03-19 ENCOUNTER — Telehealth: Payer: Self-pay

## 2019-03-19 LAB — GC/CHLAMYDIA PROBE AMP
Chlamydia trachomatis, NAA: NEGATIVE
Neisseria Gonorrhoeae by PCR: NEGATIVE

## 2019-03-19 MED ORDER — METRONIDAZOLE 500 MG PO TABS: 500.0000 mg | ORAL_TABLET | Freq: Two times a day (BID) | ORAL | 0 refills | Status: DC

## 2019-03-19 NOTE — Telephone Encounter (Signed)
Patient notified of results as written by provider 

## 2019-03-19 NOTE — Telephone Encounter (Signed)
Left message for call back.

## 2019-05-31 ENCOUNTER — Other Ambulatory Visit: Payer: Self-pay

## 2019-06-01 ENCOUNTER — Other Ambulatory Visit: Payer: Self-pay

## 2019-06-01 ENCOUNTER — Ambulatory Visit: Payer: Managed Care, Other (non HMO) | Admitting: Certified Nurse Midwife

## 2019-06-01 ENCOUNTER — Encounter: Payer: Self-pay | Admitting: Certified Nurse Midwife

## 2019-06-01 VITALS — BP 120/74 | HR 68 | Temp 97.0°F | Resp 16 | Wt 144.0 lb

## 2019-06-01 DIAGNOSIS — A630 Anogenital (venereal) warts: Secondary | ICD-10-CM | POA: Diagnosis not present

## 2019-06-01 DIAGNOSIS — K644 Residual hemorrhoidal skin tags: Secondary | ICD-10-CM

## 2019-06-01 DIAGNOSIS — N898 Other specified noninflammatory disorders of vagina: Secondary | ICD-10-CM

## 2019-06-01 NOTE — Patient Instructions (Signed)
Genital Warts Genital warts are a common STD (sexually transmitted disease). They may appear as small bumps on the skin of the genital and anal areas. They sometimes become irritated and cause pain. Genital warts are easily passed to other people through sexual contact. Many people do not know that they are infected. They may be infected for years without symptoms. However, even if they do not have symptoms, they can pass the infection to their sexual partners. Getting treatment is important because genital warts can lead to other problems. In females, the virus that causes genital warts may increase the risk for cervical cancer. What are the causes? This condition is caused by a virus that is called human papillomavirus (HPV). HPV is spread by having unprotected sex with an infected person. It can be spread through vaginal, anal, and oral sex. What increases the risk? You are more likely to develop this condition if:  You have unprotected sex.  You have multiple sexual partners.  You are sexually active before age 16.  You are a man who isnot circumcised.  You have a female sexual partner who is not circumcised.  You have a weakened body defense system (immune system) due to disease or medicine.  You smoke. What are the signs or symptoms? Symptoms of this condition include:  Small growths in the genital area or anal area. These warts often grow in clusters.  Itching and irritation in the genital area or anal area.  Bleeding from the warts.  Pain during sex. How is this diagnosed? This condition is diagnosed based on:  Your symptoms.  A physical exam. You may also have other tests, including:  Biopsy. A tissue sample is removed so it can be checked under a microscope.  Colposcopy. In females, a magnifying tool is used to examine the vagina and cervix. Certain solutions may be used to make the HPV cells change color so they can be seen more easily.  A Pap test in females.   Tests for other STDs. How is this treated? This condition may be treated with:  Medicines, such as: ? Solutions or creams applied to your skin (topical).  Procedures, such as: ? Freezing the warts with liquid nitrogen (cryotherapy). ? Burning the warts with a laser or electric probe (electrocautery). ? Surgery to remove the warts. Follow these instructions at home: Medicines   Apply over-the-counter and prescription medicines only as told by your health care provider.  Do not treat genital warts with medicines that are used for treating hand warts.  Talk with your health care provider about using over-the-counter anti-itch creams. Instructions for women  Get screened regularly for cervical cancer. Women who have genital warts are at an increased risk for this cancer. This type of cancer is slow growing and can be cured if it is found early.  If you become pregnant, tell your health care provider that you have had an HPV infection. Your health care provider will monitor you closely during pregnancy to be sure that your baby is safe. General instructions  Do not touch or scratch the warts.  Do not have sex until your treatment has been completed.  Tell your current and past sexual partners about your condition because they may also need treatment.  After treatment, use condoms during sex to prevent future infections.  Keep all follow-up visits as told by your health care provider. This is important. How is this prevented? Talk with your health care provider about getting the HPV vaccine. The vaccine:    Can prevent some HPV infections and cancers.  Is recommended for males and females who are 10-48 years old.  Is not recommended for pregnant women.  Will not work if you already have HPV. Contact a health care provider if you:  Have redness, swelling, or pain in the area of the treated skin.  Have a fever.  Feel generally ill.  Feel lumps in and around your genital or  anal area.  Have bleeding in your genital or anal area.  Have pain during sex. Summary  Genital warts are a common STD (sexually transmitted disease). It may appear as small bumps on the genital and anal areas.  This condition is caused by a virus that is called human papillomavirus (HPV). HPV is spread by having unprotected sex with an infected person. It can be spread through vaginal, anal, and oral sex.  Treatment is important because genital warts can lead to other problems. In females, the virus that causes genital warts may increase the risk for cervical cancer.  This condition may be treated with medicine that is applied to the skin, or procedures to remove the warts.  The HPV vaccine can prevent some HPV infections and cancers. It is recommended that the vaccine be given to males and females who are 12-34 years old. This information is not intended to replace advice given to you by your health care provider. Make sure you discuss any questions you have with your health care provider. Document Released: 07/19/2000 Document Revised: 08/26/2017 Document Reviewed: 08/26/2017 Elsevier Patient Education  2020 [employer]. Hemorrhoids Hemorrhoids are swollen veins that may develop:  In the butt (rectum). These are called internal hemorrhoids.  Around the opening of the butt (anus). These are called external hemorrhoids. Hemorrhoids can cause pain, itching, or bleeding. Most of the time, they do not cause serious problems. They usually get better with diet changes, lifestyle changes, and other home treatments. What are the causes? This condition may be caused by:  Having trouble pooping (constipation).  Pushing hard (straining) to poop.  Watery poop (diarrhea).  Pregnancy.  Being very overweight (obese).  Sitting for long periods of time.  Heavy lifting or other activity that causes you to strain.  Anal sex.  Riding a bike for a long period of time. What are the signs  or symptoms? Symptoms of this condition include:  Pain.  Itching or soreness in the butt.  Bleeding from the butt.  Leaking poop.  Swelling in the area.  One or more lumps around the opening of your butt. How is this diagnosed? A doctor can often diagnose this condition by looking at the affected area. The doctor may also:  Do an exam that involves feeling the area with a gloved hand (digital rectal exam).  Examine the area inside your butt using a small tube (anoscope).  Order blood tests. This may be done if you have lost a lot of blood.  Have you get a test that involves looking inside the colon using a flexible tube with a camera on the end (sigmoidoscopy or colonoscopy). How is this treated? This condition can usually be treated at home. Your doctor may tell you to change what you eat, make lifestyle changes, or try home treatments. If these do not help, procedures can be done to remove the hemorrhoids or make them smaller. These may involve:  Placing rubber bands at the base of the hemorrhoids to cut off their blood supply.  Injecting medicine into the hemorrhoids to shrink them.  Shining a type of light energy onto the hemorrhoids to cause them to fall off.  Doing surgery to remove the hemorrhoids or cut off their blood supply. Follow these instructions at home: Eating and drinking   Eat foods that have a lot of fiber in them. These include whole grains, beans, nuts, fruits, and vegetables.  Ask your doctor about taking products that have added fiber (fibersupplements).  Reduce the amount of fat in your diet. You can do this by: ? Eating low-fat dairy products. ? Eating less red meat. ? Avoiding processed foods.  Drink enough fluid to keep your pee (urine) pale yellow. Managing pain and swelling   Take a warm-water bath (sitz bath) for 20 minutes to ease pain. Do this 3-4 times a day. You may do this in a bathtub or using a portable sitz bath that fits over  the toilet.  If told, put ice on the painful area. It may be helpful to use ice between your warm baths. ? Put ice in a plastic bag. ? Place a towel between your skin and the bag. ? Leave the ice on for 20 minutes, 2-3 times a day. General instructions  Take over-the-counter and prescription medicines only as told by your doctor. ? Medicated creams and medicines may be used as told.  Exercise often. Ask your doctor how much and what kind of exercise is best for you.  Go to the bathroom when you have the urge to poop. Do not wait.  Avoid pushing too hard when you poop.  Keep your butt dry and clean. Use wet toilet paper or moist towelettes after pooping.  Do not sit on the toilet for a long time.  Keep all follow-up visits as told by your doctor. This is important. Contact a doctor if you:  Have pain and swelling that do not get better with treatment or medicine.  Have trouble pooping.  Cannot poop.  Have pain or swelling outside the area of the hemorrhoids. Get help right away if you have:  Bleeding that will not stop. Summary  Hemorrhoids are swollen veins in the butt or around the opening of the butt.  They can cause pain, itching, or bleeding.  Eat foods that have a lot of fiber in them. These include whole grains, beans, nuts, fruits, and vegetables.  Take a warm-water bath (sitz bath) for 20 minutes to ease pain. Do this 3-4 times a day. This information is not intended to replace advice given to you by your health care provider. Make sure you discuss any questions you have with your health care provider. Document Released: 04/30/2008 Document Revised: 07/30/2018 Document Reviewed: 12/11/2017 Elsevier Patient Education  2020 [employer].

## 2019-06-01 NOTE — Progress Notes (Signed)
23 y.o. Single African American female G0P0000 here with complaint of vaginal symptoms of irritation and odorous  Increase watery discharge. Onset of symptoms 4 days ago. Denies new personal products. She is treating hemorrhoid issues with Preparation H cream and this has not really helped. Has noted bright red bleeding when she wiped more than once. Denies blood in stool or black tarry stools. History of intestine surgery as a baby. Denies loose stools. Patient Type 1 Diabetes stable. Has increased fiber to help with softer stools. No STD screening desired. . Urinary symptoms none . Contraception is condoms consistent use.  Review of Systems  Constitutional: Negative.   HENT: Negative.   Eyes: Negative.   Respiratory: Negative.   Cardiovascular: Negative.   Gastrointestinal: Negative.   Genitourinary: Negative.   Musculoskeletal: Negative.   Skin:       Vaginal odor & irritation  Neurological: Negative.   Endo/Heme/Allergies: Negative.   Psychiatric/Behavioral: Negative.     O:Healthy female WDWN Affect: normal, orientation x 3  Exam: Skin: warm and dry Abdomen:soft, non tender Lymph node: no enlargement or tenderness Pelvic exam: External genital: normal female BUS: negative Vagina: watery white slightly odorous discharge noted.  Affirm taken Cervix: normal, non tender, no CMT Uterus: normal, non tender Adnexa:normal, non tender, no masses or fullness noted Rectal area: 2 small genital warts noted at top of anal area, two hemorrhoidal appearing tags noted at entrance to rectum, not inflamed or enlarged, no redness. Rectal exam: no masses or internal hemorrhoids noted. Warts shown to patient in mirror.  A:Normal pelvic exam Contraception condoms R/O vaginal infection Genital warts above anal opening Non thrombosed hemorrhoids noted  P:Discussed findings of genital warts and this is the tenderness she is noting and possible itching. Discussed genital warts and etiology with  sexually transmitted. Questions addressed at length. Treatment option or pros and cons discussed.Patient will read about and advise. Printed information given. Discussed Aveeno  sitz bath for comfort. Avoid moist clothes or pad. for extended period of time.  Lab: Affirm will treat if indicated  Rv prn

## 2019-06-02 ENCOUNTER — Telehealth: Payer: Self-pay

## 2019-06-02 LAB — VAGINITIS/VAGINOSIS, DNA PROBE
Candida Species: POSITIVE — AB
Gardnerella vaginalis: POSITIVE — AB
Trichomonas vaginosis: NEGATIVE

## 2019-06-02 MED ORDER — TERCONAZOLE 0.4 % VA CREA: 1.0000 | TOPICAL_CREAM | Freq: Every day | VAGINAL | 0 refills | Status: AC

## 2019-06-02 MED ORDER — METRONIDAZOLE 500 MG PO TABS: 500.0000 mg | ORAL_TABLET | Freq: Two times a day (BID) | ORAL | 0 refills | Status: DC

## 2019-06-02 NOTE — Telephone Encounter (Signed)
Patient notified of results & rxs sent to pharmacy. 

## 2019-06-02 NOTE — Telephone Encounter (Signed)
-----   Message from Regina Eck, CNM sent at 06/02/2019  9:23 AM EDT ----- Notify patient her vaginal screening was positive for yeast and BV. Trichomonas negative. She will need Rx Flagyl 500 mg bid x 7 no alcohol during treatment and will need Rx Terazol 7 every hs x 7. Discharge will increase during treatment.

## 2019-06-02 NOTE — Telephone Encounter (Signed)
Left message for call back.

## 2019-09-01 ENCOUNTER — Ambulatory Visit: Payer: Managed Care, Other (non HMO) | Admitting: Obstetrics and Gynecology

## 2019-09-10 NOTE — Progress Notes (Signed)
24 y.o. G0P0000 Single  African American Fe here for annual exam. Periods normal monthly, duration 6 days, heavy day 1-2, cramping minimal. Same partner, desires STD screening. Condoms inconsistent use. Desires POP Errin .if possible again to use. Diabetes Type 1 managed by [facility], all stable. Desires STD screening, no partner change. No other health issues today.  Patient's last menstrual period was 09/06/2019 (exact date).          Sexually active: Yes.    The current method of family planning is condoms.    Exercising: No.  exercise Smoker:  no  Review of Systems  Constitutional: Negative.   HENT: Negative.   Eyes: Negative.   Respiratory: Negative.   Cardiovascular: Negative.   Gastrointestinal: Negative.   Genitourinary: Negative.   Musculoskeletal: Negative.   Skin: Negative.   Neurological: Negative.   Endo/Heme/Allergies: Negative.   Psychiatric/Behavioral: Negative.     Health Maintenance: Pap:  12/17 neg per patient History of Abnormal Pap: no MMG:  none Self Breast exams: occ Colonoscopy:  none BMD:   none TDaP:  unsure Shingles: no Pneumonia: no Hep C and HIV: both neg 2020 Labs: yes   reports that she has never smoked. She has never used smokeless tobacco. She reports previous alcohol use. She reports that she does not use drugs.  Past Medical History:  Diagnosis Date  . Allergy    seasonal allergies  . Diabetes mellitus without complication ([place])   . STD (sexually transmitted disease) 2018   Tx'd for Chlamydia    Past Surgical History:  Procedure Laterality Date  . COLON SURGERY     as an infant    Current Outpatient Medications  Medication Sig Dispense Refill  . glucose blood (ACCU-CHEK AVIVA PLUS) test strip USE AS DIRECTED TO TEST BLOOD SUGAR 6-8 TIMES PER DAY. **PT NEEDS APPT** 300 each 0  . insulin aspart (NOVOLOG FLEXPEN) 100 UNIT/ML FlexPen INJECT SUBCUTANEOUSLY AS DIRECTED UP TO MAX DAILY DOSE OF 75 UNITS **PT NEEDS FOLLOW UP APPT  WITH DR GHERGHE** 10 pen 0  . Insulin Glargine (LANTUS SOLOSTAR) 100 UNIT/ML Solostar Pen Inject 28 Units into the skin daily at 10 pm. **PT NEEDS FOLLOW UP APPT WITH DR GHERGHE** 10 pen 0   No current facility-administered medications for this visit.    Family History  Problem Relation Age of Onset  . Diabetes Mother        Type I  . Lupus Mother   . Allergies Father   . Hypertension Maternal Grandmother   . Stroke Maternal Grandmother   . Diabetes Maternal Grandfather   . Glaucoma Paternal Grandfather   . Ovarian cancer Maternal Aunt     ROS:  Pertinent items are noted in HPI.  Otherwise, a comprehensive ROS was negative.  Exam:   BP 120/80   Pulse 70   Temp 97.7 F (36.5 C) (Skin)   Resp 16   Ht 4' 11.75" (1.518 m)   Wt 147 lb (66.7 kg)   LMP 09/06/2019 (Exact Date)   BMI 28.95 kg/m  Height: 4' 11.75" (151.8 cm) Ht Readings from Last 3 Encounters:  09/13/19 4' 11.75" (1.518 m)  09/09/18 4\' 11"  (1.499 m)  08/26/18 4\' 11"  (1.499 m)    General appearance: alert, cooperative and appears stated age Head: Normocephalic, without obvious abnormality, atraumatic Neck: no adenopathy, supple, symmetrical, trachea midline and thyroid normal to inspection and palpation Lungs: clear to auscultation bilaterally Breasts: normal appearance, no masses or tenderness, No nipple retraction or dimpling,  No nipple discharge or bleeding, No axillary or supraclavicular adenopathy, Taught monthly breast self examination Heart: regular rate and rhythm Abdomen: soft, non-tender; no masses,  no organomegaly, surgical scarring from colon surgery as infant Extremities: extremities normal, atraumatic, no cyanosis or edema Skin: Skin color, texture, turgor normal. No rashes or lesions Lymph nodes: Cervical, supraclavicular, and axillary nodes normal. No abnormal inguinal nodes palpated Neurologic: Grossly normal   Pelvic: External genitalia:  no lesions              Urethra:  normal appearing  urethra with no masses, tenderness or lesions              Bartholin's and Skene's: normal                 Vagina: normal appearing vagina with normal color and discharge, no lesions              Cervix: no cervical motion tenderness, no lesions and nulliparous appearance              Pap taken: Yes.   Bimanual Exam:  Uterus:  normal size, contour, position, consistency, mobility, non-tender and anteverted              Adnexa: normal adnexa and no mass, fullness, tenderness               Rectovaginal: Confirms               Anus:  normal appearance, no lesions  Chaperone present: yes  A:  Well Woman with normal exam  Contraception OCP desired  Type 1 diabetes well controlled with endocrine management  STD screening  P:   Reviewed health and wellness pertinent to exam  Discussed risks/benefits, warning signs and expectations. Stressed consistent use for protection and good period profile.  Rx Erin Geophysicist/field seismologist) see order with instructions to start with next period, use condoms for protection until one month of OCP use.  Continue with Endocrine as indicated  Lab: HIV,RPR, Hep C, Affirm, Gc/Chlamydia  Pap smear: yes   counseled on breast self exam, STD prevention, HIV risk factors and prevention, feminine hygiene, use and side effects of OCP's, adequate intake of calcium and vitamin D, diet and exercise  return annually or prn  An After Visit Summary was printed and given to the patient.

## 2019-09-13 ENCOUNTER — Encounter: Payer: Self-pay | Admitting: Certified Nurse Midwife

## 2019-09-13 ENCOUNTER — Ambulatory Visit: Payer: Managed Care, Other (non HMO) | Admitting: Certified Nurse Midwife

## 2019-09-13 ENCOUNTER — Other Ambulatory Visit (HOSPITAL_COMMUNITY)
Admission: RE | Admit: 2019-09-13 | Discharge: 2019-09-13 | Disposition: A | Discharge status: Home / Self Care | Payer: Managed Care, Other (non HMO) | Source: Ambulatory Visit | Attending: Obstetrics & Gynecology | Admitting: Obstetrics & Gynecology

## 2019-09-13 ENCOUNTER — Other Ambulatory Visit: Payer: Self-pay

## 2019-09-13 VITALS — BP 120/80 | HR 70 | Temp 97.7°F | Resp 16 | Ht 59.75 in | Wt 147.0 lb

## 2019-09-13 DIAGNOSIS — Z113 Encounter for screening for infections with a predominantly sexual mode of transmission: Secondary | ICD-10-CM

## 2019-09-13 DIAGNOSIS — Z01419 Encounter for gynecological examination (general) (routine) without abnormal findings: Secondary | ICD-10-CM | POA: Diagnosis not present

## 2019-09-13 DIAGNOSIS — Z30011 Encounter for initial prescription of contraceptive pills: Secondary | ICD-10-CM | POA: Diagnosis not present

## 2019-09-13 DIAGNOSIS — Z124 Encounter for screening for malignant neoplasm of cervix: Secondary | ICD-10-CM | POA: Diagnosis present

## 2019-09-13 MED ORDER — NORETHINDRONE 0.35 MG PO TABS: 1.0000 | ORAL_TABLET | Freq: Every day | ORAL | 11 refills | Status: DC

## 2019-09-13 NOTE — Addendum Note (Signed)
Addended by: Verner Chol on: 09/13/2019 01:32 PM   Modules accepted: Orders

## 2019-09-13 NOTE — Patient Instructions (Signed)
General topics  Next pap or exam is  due in 1 year Take a Women's multivitamin Take 1200 mg. of calcium daily - prefer dietary If any concerns in interim to call back  Breast Self-Awareness Practicing breast self-awareness may pick up problems early, prevent significant medical complications, and possibly save your life. By practicing breast self-awareness, you can become familiar with how your breasts look and feel and if your breasts are changing. This allows you to notice changes early. It can also offer you some reassurance that your breast health is good. One way to learn what is normal for your breasts and whether your breasts are changing is to do a breast self-exam. If you find a lump or something that was not present in the past, it is best to contact your caregiver right away. Other findings that should be evaluated by your caregiver include nipple discharge, especially if it is bloody; skin changes or reddening; areas where the skin seems to be pulled in (retracted); or new lumps and bumps. Breast pain is seldom associated with cancer (malignancy), but should also be evaluated by a caregiver. BREAST SELF-EXAM The best time to examine your breasts is 5 7 days after your menstrual period is over.  ExitCare Patient Information 2013 ExitCare[employer].   Exercise to Stay Healthy Exercise helps you become and stay healthy. EXERCISE IDEAS AND TIPS Choose exercises that:  You enjoy.  Fit into your day. You do not need to exercise really hard to be healthy. You can do exercises at a slow or medium level and stay healthy. You can:  Stretch before and after working out.  Try yoga, Pilates, or tai chi.  Lift weights.  Walk fast, swim, jog, run, climb stairs, bicycle, dance, or rollerskate.  Take aerobic classes. Exercises that burn about 150 calories:  Running 1  miles in 15 minutes.  Playing volleyball for 45 to 60 minutes.  Washing and waxing a car for 45 to 60  minutes.  Playing touch football for 45 minutes.  Walking 1  miles in 35 minutes.  Pushing a stroller 1  miles in 30 minutes.  Playing basketball for 30 minutes.  Raking leaves for 30 minutes.  Bicycling 5 miles in 30 minutes.  Walking 2 miles in 30 minutes.  Dancing for 30 minutes.  Shoveling snow for 15 minutes.  Swimming laps for 20 minutes.  Walking up stairs for 15 minutes.  Bicycling 4 miles in 15 minutes.  Gardening for 30 to 45 minutes.  Jumping rope for 15 minutes.  Washing windows or floors for 45 to 60 minutes. Document Released: 08/24/2010 Document Revised: 10/14/2011 Document Reviewed: 08/24/2010 ExitCare Patient Information 2013 ExitCare[employer].   Other topics ( that may be useful information):    Sexually Transmitted Disease Sexually transmitted disease (STD) refers to any infection that is passed from person to person during sexual activity. This may happen by way of saliva, semen, blood, vaginal mucus, or urine. Common STDs include:  Gonorrhea.  Chlamydia.  Syphilis.  HIV/AIDS.  Genital herpes.  Hepatitis B and C.  Trichomonas.  Human papillomavirus (HPV).  Pubic lice. CAUSES  An STD may be spread by bacteria, virus, or parasite. A person can get an STD by:  Sexual intercourse with an infected person.  Sharing sex toys with an infected person.  Sharing needles with an infected person.  Having intimate contact with the genitals, mouth, or rectal areas of an infected person. SYMPTOMS  Some people may not have any symptoms, but   they can still pass the infection to others. Different STDs have different symptoms. Symptoms include:  Painful or bloody urination.  Pain in the pelvis, abdomen, vagina, anus, throat, or eyes.  Skin rash, itching, irritation, growths, or sores (lesions). These usually occur in the genital or anal area.  Abnormal vaginal discharge.  Penile discharge in men.  Soft, flesh-colored skin growths in the  genital or anal area.  Fever.  Pain or bleeding during sexual intercourse.  Swollen glands in the groin area.  Yellow skin and eyes (jaundice). This is seen with hepatitis. DIAGNOSIS  To make a diagnosis, your caregiver may:  Take a medical history.  Perform a physical exam.  Take a specimen (culture) to be examined.  Examine a sample of discharge under a microscope.  Perform blood test TREATMENT   Chlamydia, gonorrhea, trichomonas, and syphilis can be cured with antibiotic medicine.  Genital herpes, hepatitis, and HIV can be treated, but not cured, with prescribed medicines. The medicines will lessen the symptoms.  Genital warts from HPV can be treated with medicine or by freezing, burning (electrocautery), or surgery. Warts may come back.  HPV is a virus and cannot be cured with medicine or surgery.However, abnormal areas may be followed very closely by your caregiver and may be removed from the cervix, vagina, or vulva through office procedures or surgery. If your diagnosis is confirmed, your recent sexual partners need treatment. This is true even if they are symptom-free or have a negative culture or evaluation. They should not have sex until their caregiver says it is okay. HOME CARE INSTRUCTIONS  All sexual partners should be informed, tested, and treated for all STDs.  Take your antibiotics as directed. Finish them even if you start to feel better.  Only take over-the-counter or prescription medicines for pain, discomfort, or fever as directed by your caregiver.  Rest.  Eat a balanced diet and drink enough fluids to keep your urine clear or pale yellow.  Do not have sex until treatment is completed and you have followed up with your caregiver. STDs should be checked after treatment.  Keep all follow-up appointments, Pap tests, and blood tests as directed by your caregiver.  Only use latex condoms and water-soluble lubricants during sexual activity. Do not use  petroleum jelly or oils.  Avoid alcohol and illegal drugs.  Get vaccinated for HPV and hepatitis. If you have not received these vaccines in the past, talk to your caregiver about whether one or both might be right for you.  Avoid risky sex practices that can break the skin. The only way to avoid getting an STD is to avoid all sexual activity.Latex condoms and dental dams (for oral sex) will help lessen the risk of getting an STD, but will not completely eliminate the risk. SEEK MEDICAL CARE IF:   You have a fever.  You have any new or worsening symptoms. Document Released: 10/12/2002 Document Revised: 10/14/2011 Document Reviewed: 10/19/2010 [facility] Patient Information 2013 [place].    Domestic Abuse You are being battered or abused if someone close to you hits, pushes, or physically hurts you in any way. You also are being abused if you are forced into activities. You are being sexually abused if you are forced to have sexual contact of any kind. You are being emotionally abused if you are made to feel worthless or if you are constantly threatened. It is important to remember that help is available. No one has the right to abuse you. PREVENTION OF FURTHER  ABUSE  Learn the warning signs of danger. This varies with situations but may include: the use of alcohol, threats, isolation from friends and family, or forced sexual contact. Leave if you feel that violence is going to occur.  If you are attacked or beaten, report it to the police so the abuse is documented. You do not have to press charges. The police can protect you while you or the attackers are leaving. Get the officer's name and badge number and a copy of the report.  Find someone you can trust and tell them what is happening to you: your caregiver, a nurse, clergy member, close friend or family member. Feeling ashamed is natural, but remember that you have done nothing wrong. No one deserves abuse. Document Released:  07/19/2000 Document Revised: 10/14/2011 Document Reviewed: 09/27/2010 ExitCare Patient Information 2013 [employer]Care[employer].    How Much is Too Much Alcohol? Drinking too much alcohol can cause injury, accidents, and health problems. These types of problems can include:   Car crashes.  Falls.  Family fighting (domestic violence).  Drowning.  Fights.  Injuries.  Burns.  Damage to certain organs.  Having a baby with birth defects. ONE DRINK CAN BE TOO MUCH WHEN YOU ARE:  Working.  Pregnant or breastfeeding.  Taking medicines. Ask your doctor.  Driving or planning to drive. If you or someone you know has a drinking problem, get help from a doctor.  Document Released: 05/18/2009 Document Revised: 10/14/2011 Document Reviewed: 05/18/2009 ExitCare Patient Information 2013 ExitCare[employer].   Smoking Hazards Smoking cigarettes is extremely bad for your health. Tobacco smoke has over 200 known poisons in it. There are over 60 chemicals in tobacco smoke that cause cancer. Some of the chemicals found in cigarette smoke include:   Cyanide.  Benzene.  Formaldehyde.  Methanol (wood alcohol).  Acetylene (fuel used in welding torches).  Ammonia. Cigarette smoke also contains the poisonous gases nitrogen oxide and carbon monoxide.  Cigarette smokers have an increased risk of many serious medical problems and Smoking causes approximately:  90% of all lung cancer deaths in men.  80% of all lung cancer deaths in women.  90% of deaths from chronic obstructive lung disease. Compared with nonsmokers, smoking increases the risk of:  Coronary heart disease by 2 to 4 times.  Stroke by 2 to 4 times.  Men developing lung cancer by 23 times.  Women developing lung cancer by 13 times.  Dying from chronic obstructive lung diseases by 12 times.  . Smoking is the most preventable cause of death and disease in our society.  WHY IS SMOKING ADDICTIVE?  Nicotine is the chemical  agent in tobacco that is capable of causing addiction or dependence.  When you smoke and inhale, nicotine is absorbed rapidly into the bloodstream through your lungs. Nicotine absorbed through the lungs is capable of creating a powerful addiction. Both inhaled and non-inhaled nicotine may be addictive.  Addiction studies of cigarettes and spit tobacco show that addiction to nicotine occurs mainly during the teen years, when young people begin using tobacco products. WHAT ARE THE BENEFITS OF QUITTING?  There are many health benefits to quitting smoking.   Likelihood of developing cancer and heart disease decreases. Health improvements are seen almost immediately.  Blood pressure, pulse rate, and breathing patterns start returning to normal soon after quitting. QUITTING SMOKING   American Lung Association - 1-800-LUNGUSA  American Cancer Society - 1-800-ACS-2345 Document Released: 08/29/2004 Document Revised: 10/14/2011 Document Reviewed: 05/03/2009 ExitCare Patient Information 2013 ExitCare,   LLC.   Stress Management Stress is a state of physical or mental tension that often results from changes in your life or normal routine. Some common causes of stress are:  Death of a loved one.  Injuries or severe illnesses.  Getting fired or changing jobs.  Moving into a new home. Other causes may be:  Sexual problems.  Business or financial losses.  Taking on a large debt.  Regular conflict with someone at home or at work.  Constant tiredness from lack of sleep. It is not just bad things that are stressful. It may be stressful to:  Win the lottery.  Get married.  Buy a new car. The amount of stress that can be easily tolerated varies from person to person. Changes generally cause stress, regardless of the types of change. Too much stress can affect your health. It may lead to physical or emotional problems. Too little stress (boredom) may also become stressful. SUGGESTIONS TO  REDUCE STRESS:  Talk things over with your family and friends. It often is helpful to share your concerns and worries. If you feel your problem is serious, you may want to get help from a professional counselor.  Consider your problems one at a time instead of lumping them all together. Trying to take care of everything at once may seem impossible. List all the things you need to do and then start with the most important one. Set a goal to accomplish 2 or 3 things each day. If you expect to do too many in a single day you will naturally fail, causing you to feel even more stressed.  Do not use alcohol or drugs to relieve stress. Although you may feel better for a short time, they do not remove the problems that caused the stress. They can also be habit forming.  Exercise regularly - at least 3 times per week. Physical exercise can help to relieve that "uptight" feeling and will relax you.  The shortest distance between despair and hope is often a good night's sleep.  Go to bed and get up on time allowing yourself time for appointments without being rushed.  Take a short "time-out" period from any stressful situation that occurs during the day. Close your eyes and take some deep breaths. Starting with the muscles in your face, tense them, hold it for a few seconds, then relax. Repeat this with the muscles in your neck, shoulders, hand, stomach, back and legs.  Take good care of yourself. Eat a balanced diet and get plenty of rest.  Schedule time for having fun. Take a break from your daily routine to relax. HOME CARE INSTRUCTIONS   Call if you feel overwhelmed by your problems and feel you can no longer manage them on your own.  Return immediately if you feel like hurting yourself or someone else. Document Released: 01/15/2001 Document Revised: 10/14/2011 Document Reviewed: 09/07/2007 ExitCare Patient Information 2013 ExitCare, LLC.   Oral Contraception Use Oral contraceptive pills  (OCPs) are medicines that you take to prevent pregnancy. OCPs work by:  Preventing the ovaries from releasing eggs.  Thickening mucus in the lower part of the uterus (cervix), which prevents sperm from entering the uterus.  Thinning the lining of the uterus (endometrium), which prevents a fertilized egg from attaching to the endometrium. OCPs are highly effective when taken exactly as prescribed. However, OCPs do not prevent sexually transmitted infections (STIs). Safe sex practices, such as using condoms while on an OCP, can help prevent STIs. Before taking   OCPs, you may have a physical exam, blood test, and Pap test. A Pap test involves taking a sample of cells from your cervix to check for cancer. Discuss with your health care provider the possible side effects of the OCP you may be prescribed. When you start an OCP, be aware that it can take 2-3 months for your body to adjust to changes in hormone levels. How to take oral contraceptive pills Follow instructions from your health care provider about how to start taking your first cycle of OCPs. Your health care provider may recommend that you:  Start the pill on day 1 of your menstrual period. If you start at this time, you will not need any backup form of birth control (contraception), such as condoms.  Start the pill on the first Sunday after your menstrual period or on the day you get your prescription. In these cases, you will need to use backup contraception for the first week.  Start the pill at any time of your cycle. ? If you take the pill within 5 days of the start of your period, you will not need a backup form of contraception. ? If you start at any other time of your menstrual cycle, you will need to use another form of contraception for 7 days. If your OCP is the type called a minipill, it will protect you from pregnancy after taking it for 2 days (48 hours), and you can stop using backup contraception after that time. After you  have started taking OCPs:  If you forget to take 1 pill, take it as soon as you remember. Take the next pill at the regular time.  If you miss 2 or more pills, call your health care provider. Different pills have different instructions for missed doses. Use backup birth control until your next menstrual period starts.  If you use a 28-day pack that contains inactive pills and you miss 1 of the last 7 pills (pills with no hormones), throw away the rest of the non-hormone pills and start a new pill pack. No matter which day you start the OCP, you will always start a new pack on that same day of the week. Have an extra pack of OCPs and a backup contraceptive method available in case you miss some pills or lose your OCP pack. Follow these instructions at home:  Do not use any products that contain nicotine or tobacco, such as cigarettes and e-cigarettes. If you need help quitting, ask your health care provider.  Always use a condom to protect against STIs. OCPs do not protect against STIs.  Use a calendar to mark the days of your menstrual period.  Read the information and directions that came with your OCP. Talk to your health care provider if you have questions. Contact a health care provider if:  You develop nausea and vomiting.  You have abnormal vaginal discharge or bleeding.  You develop a rash.  You miss your menstrual period. Depending on the type of OCP you are taking, this may be a sign of pregnancy. Ask your health care provider for more information.  You are losing your hair.  You need treatment for mood swings or depression.  You get dizzy when taking the OCP.  You develop acne after taking the OCP.  You become pregnant or think you may be pregnant.  You have diarrhea, constipation, and abdominal pain or cramps.  You miss 2 or more pills. Get help right away if:  You develop chest   pain.  You develop shortness of breath.  You have an uncontrolled or severe  headache.  You develop numbness or slurred speech.  You develop visual or speech problems.  You develop pain, redness, and swelling in your legs.  You develop weakness or numbness in your arms or legs. Summary  Oral contraceptive pills (OCPs) are medicines that you take to prevent pregnancy.  OCPs do not prevent sexually transmitted infections (STIs). Always use a condom to protect against STIs.  When you start an OCP, be aware that it can take 2-3 months for your body to adjust to changes in hormone levels.  Read all the information and directions that come with your OCP. This information is not intended to replace advice given to you by your health care provider. Make sure you discuss any questions you have with your health care provider. Document Revised: 11/13/2018 Document Reviewed: 09/02/2016 Elsevier Patient Education  [place].

## 2019-09-14 LAB — RPR: RPR Ser Ql: NONREACTIVE

## 2019-09-14 LAB — HEPATITIS C ANTIBODY: Hep C Virus Ab: <0.1 s/co ratio (ref 0.0–0.9)

## 2019-09-14 LAB — HIV ANTIBODY (ROUTINE TESTING W REFLEX): HIV Screen 4th Generation wRfx: NONREACTIVE

## 2019-09-15 LAB — CYTOLOGY - PAP
Chlamydia: NEGATIVE
Comment: NEGATIVE
Comment: NORMAL
Diagnosis: NEGATIVE
Diagnosis: REACTIVE
Neisseria Gonorrhea: NEGATIVE

## 2019-09-15 LAB — NUSWAB BV AND CANDIDA, NAA
Atopobium vaginae: HIGH Score — AB
Candida albicans, NAA: NEGATIVE
Candida glabrata, NAA: NEGATIVE
Megasphaera 1: HIGH Score — AB

## 2019-09-16 ENCOUNTER — Other Ambulatory Visit: Payer: Self-pay

## 2019-09-16 DIAGNOSIS — N760 Acute vaginitis: Secondary | ICD-10-CM

## 2019-09-16 DIAGNOSIS — N898 Other specified noninflammatory disorders of vagina: Secondary | ICD-10-CM

## 2019-09-16 MED ORDER — METRONIDAZOLE 500 MG PO TABS: 500.0000 mg | ORAL_TABLET | Freq: Two times a day (BID) | ORAL | 0 refills | Status: DC

## 2019-10-18 ENCOUNTER — Encounter: Payer: Self-pay | Admitting: Certified Nurse Midwife

## 2019-10-20 ENCOUNTER — Encounter: Payer: Self-pay | Admitting: Certified Nurse Midwife

## 2020-01-17 ENCOUNTER — Telehealth: Payer: Self-pay

## 2020-01-17 NOTE — Telephone Encounter (Signed)
Patient is calling in regards to having discharge and would like STD testing.

## 2020-01-17 NOTE — Telephone Encounter (Signed)
Spoke with patient. Patient reports thick, white vaginal d/c with odor for 3 days. Denies any other symptoms. Reports menses are regular, on POP, LMP approximately 2 wks ago. Denies any partner changes. Hx of chlamydia. Requesting OV for STD testing and evaluation.   Last AEX 09/13/19 w/ Leota Sauers, CNM  OV scheduled for 01/19/20 ta 8am with Dr. Oscar La. Instructed patient to return call to office for earlier OV if new symptoms develop.   Routing to provider for final review. Patient is agreeable to disposition. Will close encounter.

## 2020-01-19 ENCOUNTER — Ambulatory Visit: Payer: Managed Care, Other (non HMO) | Admitting: Obstetrics and Gynecology

## 2020-01-19 ENCOUNTER — Other Ambulatory Visit: Payer: Self-pay

## 2020-01-19 ENCOUNTER — Encounter: Payer: Self-pay | Admitting: Obstetrics and Gynecology

## 2020-01-19 ENCOUNTER — Other Ambulatory Visit: Payer: Managed Care, Other (non HMO)

## 2020-01-19 VITALS — BP 130/76 | HR 97 | Temp 97.7°F | Ht 59.0 in | Wt 144.0 lb

## 2020-01-19 DIAGNOSIS — N898 Other specified noninflammatory disorders of vagina: Secondary | ICD-10-CM

## 2020-01-19 DIAGNOSIS — Z113 Encounter for screening for infections with a predominantly sexual mode of transmission: Secondary | ICD-10-CM

## 2020-01-19 DIAGNOSIS — N760 Acute vaginitis: Secondary | ICD-10-CM | POA: Diagnosis not present

## 2020-01-19 NOTE — Progress Notes (Signed)
GYNECOLOGY  VISIT   HPI: 24 y.o.   Single Black or African American Not Hispanic or Latino  female   [id number] with Patient's last menstrual period was 12/31/2019 (approximate).   here for vaginal discharge. She says that she has a thick white discharge. She states that she noticed the discharge starting around Friday or Saturday.  No itching, burning or irritation. Just feels wet.  Patient would also like STD testing today. Same partner since March, not using condoms. Prior partner had hsv.  She has had recurrent BV. BV in 2/21, 10/20 and 8/20.  GYNECOLOGIC HISTORY: Patient's last menstrual period was 12/31/2019 (approximate). Contraception: Micronor Menopausal hormone therapy: none        OB History    Gravida  0   Para  0   Term  0   Preterm  0   AB  0   Living  0     SAB  0   TAB  0   Ectopic  0   Multiple  0   Live Births  0              Patient Active Problem List   Diagnosis Date Noted  . Type 1 diabetes mellitus not at goal [facility]) 10/05/2013  . Family history of other cardiovascular diseases(V17.49) 11/13/2009  . Uncontrolled type 1 diabetes mellitus (HCC) 06/01/2003    Past Medical History:  Diagnosis Date  . Allergy    seasonal allergies  . Diabetes mellitus without complication (HCC)   . STD (sexually transmitted disease) 2018   Tx'd for Chlamydia    Past Surgical History:  Procedure Laterality Date  . COLON SURGERY     as an infant    Current Outpatient Medications  Medication Sig Dispense Refill  . glucose blood (ACCU-CHEK AVIVA PLUS) test strip USE AS DIRECTED TO TEST BLOOD SUGAR 6-8 TIMES PER DAY. **PT NEEDS APPT** 300 each 0  . insulin aspart (NOVOLOG FLEXPEN) 100 UNIT/ML FlexPen INJECT SUBCUTANEOUSLY AS DIRECTED UP TO MAX DAILY DOSE OF 75 UNITS **PT NEEDS FOLLOW UP APPT WITH DR GHERGHE** 10 pen 0  . Insulin Glargine (LANTUS SOLOSTAR) 100 UNIT/ML Solostar Pen Inject 28 Units into the skin daily at 10 pm. **PT NEEDS FOLLOW UP APPT WITH  DR GHERGHE** 10 pen 0  . norethindrone (MICRONOR) 0.35 MG tablet Take 1 tablet (0.35 mg total) by mouth daily. 1 Package 11   No current facility-administered medications for this visit.     ALLERGIES: Patient has no known allergies.  Family History  Problem Relation Age of Onset  . Diabetes Mother        Type I  . Lupus Mother   . Allergies Father   . Hypertension Maternal Grandmother   . Stroke Maternal Grandmother   . Diabetes Maternal Grandfather   . Glaucoma Paternal Grandfather   . Ovarian cancer Maternal Aunt     Social History   Socioeconomic History  . Marital status: Single    Spouse name: Not on file  . Number of children: Not on file  . Years of education: Not on file  . Highest education level: Not on file  Occupational History  . Not on file  Tobacco Use  . Smoking status: Never Smoker  . Smokeless tobacco: Never Used  Vaping Use  . Vaping Use: Never used  Substance and Sexual Activity  . Alcohol use: Not Currently  . Drug use: Never  . Sexual activity: Yes    Partners: Male  Birth control/protection: None  Other Topics Concern  . Not on file  Social History Narrative  . Not on file   Social Determinants of Health   Financial Resource Strain:   . Difficulty of Paying Living Expenses:   Food Insecurity:   . Worried About Charity fundraiser in the Last Year:   . Arboriculturist in the Last Year:   Transportation Needs:   . Film/video editor (Medical):   Marland Kitchen Lack of Transportation (Non-Medical):   Physical Activity:   . Days of Exercise per Week:   . Minutes of Exercise per Session:   Stress:   . Feeling of Stress :   Social Connections:   . Frequency of Communication with Friends and Family:   . Frequency of Social Gatherings with Friends and Family:   . Attends Religious Services:   . Active Member of Clubs or Organizations:   . Attends Archivist Meetings:   Marland Kitchen Marital Status:   Intimate Partner Violence:   . Fear of  Current or Ex-Partner:   . Emotionally Abused:   Marland Kitchen Physically Abused:   . Sexually Abused:     Review of Systems  Genitourinary:       Vaginal Discharge.   All other systems reviewed and are negative.   PHYSICAL EXAMINATION:    BP 130/76   Pulse 97   Temp 97.7 F (36.5 C)   Ht 4\' 11"  (1.499 m)   Wt 144 lb (65.3 kg)   LMP 12/31/2019 (Approximate)   SpO2 100%   BMI 29.08 kg/m     General appearance: alert, cooperative and appears stated age  Pelvic: External genitalia:  no lesions              Urethra:  normal appearing urethra with no masses, tenderness or lesions              Bartholins and Skenes: normal                 Vagina: normal appearing vagina with an increase in thick, white vaginal discharge              Cervix: no lesions and up under her pubic symphysis               Chaperone was present for exam.  Wet prep: ? clue, no trich, few wbc KOH: ? yeast PH: 5   ASSESSMENT Vaginal discharge, slides not clear STD testing C/O recurrent BV, she had + testing 3 x in the last year    PLAN Nuswab vaginitis and STD testing. If + for BV would treat with oral flagyl (then 6 months of metrogel) Blood work for STD testing, desires hsv testing  Discussed using condoms for STD protection and to decrease the risk of BV If she is + for BV again would recommend suppression. Discussed suppression.   ~25 minutes in total patient care

## 2020-01-19 NOTE — Patient Instructions (Signed)
Safe Sex Practicing safe sex means taking steps before and during sex to reduce your risk of:  Getting an STI (sexually transmitted infection).  Giving your partner an STI.  Unwanted or unplanned pregnancy. How can I practice safe sex?     Ways you can practice safe sex  Limit your sexual partners to only one partner who is having sex with only you.  Avoid using alcohol and drugs before having sex. Alcohol and drugs can affect your judgment.  Before having sex with a new partner: ? Talk to your partner about past partners, past STIs, and drug use. ? Get screened for STIs and discuss the results with your partner. Ask your partner to get screened, too.  Check your body regularly for sores, blisters, rashes, or unusual discharge. If you notice any of these problems, visit your health care provider.  Avoid sexual contact if you have symptoms of an infection or you are being treated for an STI.  While having sex, use a condom. Make sure to: ? Use a condom every time you have vaginal, oral, or anal sex. Both females and males should wear condoms during oral sex. ? Keep condoms in place from the beginning to the end of sexual activity. ? Use a latex condom, if possible. Latex condoms offer the best protection. ? Use only water-based lubricants with a condom. Using petroleum-based lubricants or oils will weaken the condom and increase the chance that it will break. Ways your health care provider can help you practice safe sex  See your health care provider for regular screenings, exams, and tests for STIs.  Talk with your health care provider about what kind of birth control (contraception) is best for you.  Get vaccinated against hepatitis B and human papillomavirus (HPV).  If you are at risk of being infected with HIV (human immunodeficiency virus), talk with your health care provider about taking a prescription medicine to prevent HIV infection. You are at risk for HIV if  you: ? Are a man who has sex with other men. ? Are sexually active with more than one partner. ? Take drugs by injection. ? Have a sex partner who has HIV. ? Have unprotected sex. ? Have sex with someone who has sex with both men and women. ? Have had an STI. Follow these instructions at home:  Take over-the-counter and prescription medicines as told by your health care provider.  Keep all follow-up visits as told by your health care provider. This is important. Where to find more information  Centers for Disease Control and Prevention: https://www.cdc.gov/std/prevention/default.htm  Planned Parenthood: https://www.plannedparenthood.org/  Office on Women's Health: https://www.womenshealth.gov/a-z-topics/sexually-transmitted-infections Summary  Practicing safe sex means taking steps before and during sex to reduce your risk of STIs, giving your partner STIs, and having an unwanted or unplanned pregnancy.  Before having sex with a new partner, talk to your partner about past partners, past STIs, and drug use.  Use a condom every time you have vaginal, oral, or anal sex. Both females and males should wear condoms during oral sex.  Check your body regularly for sores, blisters, rashes, or unusual discharge. If you notice any of these problems, visit your health care provider.  See your health care provider for regular screenings, exams, and tests for STIs. This information is not intended to replace advice given to you by your health care provider. Make sure you discuss any questions you have with your health care provider. Document Revised: 11/13/2018 Document Reviewed: 05/04/2018 Elsevier Patient Education    [place]. Vaginitis Vaginitis is a condition in which the vaginal tissue swells and becomes red (inflamed). This condition is most often caused by a change in the normal balance of bacteria and yeast that live in the vagina. This change causes an overgrowth of certain  bacteria or yeast, which causes the inflammation. There are different types of vaginitis, but the most common types are:  Bacterial vaginosis.  Yeast infection (candidiasis).  Trichomoniasis vaginitis. This is a sexually transmitted disease (STD).  Viral vaginitis.  Atrophic vaginitis.  Allergic vaginitis. What are the causes? The cause of this condition depends on the type of vaginitis. It can be caused by:  Bacteria (bacterial vaginosis).  Yeast, which is a fungus (yeast infection).  A parasite (trichomoniasis vaginitis).  A virus (viral vaginitis).  Low hormone levels (atrophic vaginitis). Low hormone levels can occur during pregnancy, breastfeeding, or after menopause.  Irritants, such as bubble baths, scented tampons, and feminine sprays (allergic vaginitis). Other factors can change the normal balance of the yeast and bacteria that live in the vagina. These include:  Antibiotic medicines.  Poor hygiene.  Diaphragms, vaginal sponges, spermicides, birth control pills, and intrauterine devices (IUD).  Sex.  Infection.  Uncontrolled diabetes.  A weakened defense (immune) system. What increases the risk? This condition is more likely to develop in women who:  Smoke.  Use vaginal douches, scented tampons, or scented sanitary pads.  Wear tight-fitting pants.  Wear thong underwear.  Use oral birth control pills or an IUD.  Have sex without a condom.  Have multiple sex partners.  Have an STD.  Frequently use the spermicide nonoxynol-9.  Eat lots of foods high in sugar.  Have uncontrolled diabetes.  Have low estrogen levels.  Have a weakened immune system from an immune disorder or medical treatment.  Are pregnant or breastfeeding. What are the signs or symptoms? Symptoms vary depending on the cause of the vaginitis. Common symptoms include:  Abnormal vaginal discharge. ? The discharge is white, gray, or yellow with bacterial vaginosis. ? The  discharge is thick, white, and cheesy with a yeast infection. ? The discharge is frothy and yellow or greenish with trichomoniasis.  A bad vaginal smell. The smell is fishy with bacterial vaginosis.  Vaginal itching, pain, or swelling.  Sex that is painful.  Pain or burning when urinating. Sometimes there are no symptoms. How is this diagnosed? This condition is diagnosed based on your symptoms and medical history. A physical exam, including a pelvic exam, will also be done. You may also have other tests, including:  Tests to determine the pH level (acidity or alkalinity) of your vagina.  A whiff test, to assess the odor that results when a sample of your vaginal discharge is mixed with a potassium hydroxide solution.  Tests of vaginal fluid. A sample will be examined under a microscope. How is this treated? Treatment varies depending on the type of vaginitis you have. Your treatment may include:  Antibiotic creams or pills to treat bacterial vaginosis and trichomoniasis.  Antifungal medicines, such as vaginal creams or suppositories, to treat a yeast infection.  Medicine to ease discomfort if you have viral vaginitis. Your sexual partner should also be treated.  Estrogen delivered in a cream, pill, suppository, or vaginal ring to treat atrophic vaginitis. If vaginal dryness occurs, lubricants and moisturizing creams may help. You may need to avoid scented soaps, sprays, or douches.  Stopping use of a product that is causing allergic vaginitis. Then using a vaginal cream to  treat the symptoms. Follow these instructions at home: Lifestyle  Keep your genital area clean and dry. Avoid soap, and only rinse the area with water.  Do not douche or use tampons until your health care provider says it is okay to do so. Use sanitary pads, if needed.  Do not have sex until your health care provider approves. When you can return to sex, practice safe sex and use condoms.  Wipe from front  to back. This avoids the spread of bacteria from the rectum to the vagina. General instructions  Take over-the-counter and prescription medicines only as told by your health care provider.  If you were prescribed an antibiotic medicine, take or use it as told by your health care provider. Do not stop taking or using the antibiotic even if you start to feel better.  Keep all follow-up visits as told by your health care provider. This is important. How is this prevented?  Use mild, non-scented products. Do not use things that can irritate the vagina, such as fabric softeners. Avoid the following products if they are scented: ? Feminine sprays. ? Detergents. ? Tampons. ? Feminine hygiene products. ? Soaps or bubble baths.  Let air reach your genital area. ? Wear cotton underwear to reduce moisture buildup. ? Avoid wearing underwear while you sleep. ? Avoid wearing tight pants and underwear or nylons without a cotton panel. ? Avoid wearing thong underwear.  Take off any wet clothing, such as bathing suits, as soon as possible.  Practice safe sex and use condoms. Contact a health care provider if:  You have abdominal pain.  You have a fever.  You have symptoms that last for more than 2-3 days. Get help right away if:  You have a fever and your symptoms suddenly get worse. Summary  Vaginitis is a condition in which the vaginal tissue becomes inflamed.This condition is most often caused by a change in the normal balance of bacteria and yeast that live in the vagina.  Treatment varies depending on the type of vaginitis you have.  Do not douche, use tampons , or have sex until your health care provider approves. When you can return to sex, practice safe sex and use condoms. This information is not intended to replace advice given to you by your health care provider. Make sure you discuss any questions you have with your health care provider. Document Revised: 07/04/2017 Document  Reviewed: 08/27/2016 Elsevier Patient Education  2020 [employer].

## 2020-01-20 LAB — HEP, RPR, HIV PANEL
HIV Screen 4th Generation wRfx: NONREACTIVE
Hepatitis B Surface Ag: NEGATIVE
RPR Ser Ql: NONREACTIVE

## 2020-01-20 LAB — HSV(HERPES SIMPLEX VRS) I + II AB-IGG
HSV 1 Glycoprotein G Ab, IgG: 1.3 index — ABNORMAL HIGH (ref 0.00–0.90)
HSV 2 IgG, Type Spec: <0.91 index (ref 0.00–0.90)

## 2020-01-20 LAB — HEPATITIS C ANTIBODY: Hep C Virus Ab: <0.1 s/co ratio (ref 0.0–0.9)

## 2020-01-21 ENCOUNTER — Telehealth: Payer: Self-pay

## 2020-01-21 NOTE — Telephone Encounter (Signed)
Routing to Dr. Oscar La to review 01/19/20 results and advise.

## 2020-01-21 NOTE — Telephone Encounter (Signed)
Patient called in regards to discussing results.

## 2020-01-22 ENCOUNTER — Other Ambulatory Visit: Payer: Self-pay | Admitting: Obstetrics and Gynecology

## 2020-01-22 MED ORDER — METRONIDAZOLE 500 MG PO TABS: 500.0000 mg | ORAL_TABLET | Freq: Two times a day (BID) | ORAL | 0 refills | Status: DC

## 2020-01-22 MED ORDER — METRONIDAZOLE 0.75 % VA GEL: VAGINAL | 2 refills | Status: DC

## 2020-01-22 NOTE — Telephone Encounter (Signed)
mychart message sent

## 2020-01-23 LAB — NUSWAB VAGINITIS PLUS (VG+)
Atopobium vaginae: HIGH Score — AB
BVAB 2: HIGH Score — AB
Candida albicans, NAA: NEGATIVE
Candida glabrata, NAA: NEGATIVE
Chlamydia trachomatis, NAA: NEGATIVE
Megasphaera 1: HIGH Score — AB
Neisseria gonorrhoeae, NAA: NEGATIVE
Trich vag by NAA: NEGATIVE

## 2020-09-15 ENCOUNTER — Ambulatory Visit: Payer: Managed Care, Other (non HMO) | Admitting: Certified Nurse Midwife

## 2020-09-29 ENCOUNTER — Other Ambulatory Visit: Payer: Self-pay

## 2020-09-29 ENCOUNTER — Ambulatory Visit: Payer: 59 | Admitting: Obstetrics and Gynecology

## 2020-09-29 ENCOUNTER — Encounter: Payer: Self-pay | Admitting: Obstetrics and Gynecology

## 2020-09-29 VITALS — BP 132/88 | HR 105 | Ht 60.25 in | Wt 153.0 lb

## 2020-09-29 DIAGNOSIS — N644 Mastodynia: Secondary | ICD-10-CM

## 2020-09-29 DIAGNOSIS — Z01419 Encounter for gynecological examination (general) (routine) without abnormal findings: Secondary | ICD-10-CM | POA: Diagnosis not present

## 2020-09-29 DIAGNOSIS — Z113 Encounter for screening for infections with a predominantly sexual mode of transmission: Secondary | ICD-10-CM | POA: Diagnosis not present

## 2020-09-29 DIAGNOSIS — Z3041 Encounter for surveillance of contraceptive pills: Secondary | ICD-10-CM

## 2020-09-29 MED ORDER — NORETHINDRONE 0.35 MG PO TABS: 1.0000 | ORAL_TABLET | Freq: Every day | ORAL | 3 refills | Status: DC

## 2020-09-29 NOTE — Patient Instructions (Signed)
EXERCISE   We recommended that you start or continue a regular exercise program for good health. Physical activity is anything that gets your body moving, some is better than none. The CDC recommends 150 minutes per week of Moderate-Intensity Aerobic Activity and 2 or more days of Muscle Strengthening Activity.  Benefits of exercise are limitless: helps weight loss/weight maintenance, improves mood and energy, helps with depression and anxiety, improves sleep, tones and strengthens muscles, improves balance, improves bone density, protects from chronic conditions such as heart disease, high blood pressure and diabetes and so much more. To learn more visit: https://www.cdc.gov/physicalactivity/index.html  DIET: Good nutrition starts with a healthy diet of fruits, vegetables, whole grains, and lean protein sources. Drink plenty of water for hydration. Minimize empty calories, sodium, sweets. For more information about dietary recommendations visit: https://health.gov/our-work/nutrition-physical-activity/dietary-guidelines and https://www.myplate.gov/  ALCOHOL:  Women should limit their alcohol intake to no more than 7 drinks/beers/glasses of wine (combined, not each!) per week. Moderation of alcohol intake to this level decreases your risk of breast cancer and liver damage.  If you are concerned that you may have a problem, or your friends have told you they are concerned about your drinking, there are many resources to help. A well-known program that is free, effective, and available to all people all over the nation is Alcoholics Anonymous.  Check out this site to learn more: https://www.aa.org/   CALCIUM AND VITAMIN D:  Adequate intake of calcium and Vitamin D are recommended for bone health.  You should be getting between 1000-1200 mg of calcium and 800 units of Vitamin D daily between diet and supplements  PAP SMEARS:  Pap smears, to check for cervical cancer or precancers,  have traditionally been  done yearly, scientific advances have shown that most women can have pap smears less often.  However, every woman still should have a physical exam from her gynecologist every year. It will include a breast check, inspection of the vulva and vagina to check for abnormal growths or skin changes, a visual exam of the cervix, and then an exam to evaluate the size and shape of the uterus and ovaries. We will also provide age appropriate advice regarding health maintenance, like when you should have certain vaccines, screening for sexually transmitted diseases, bone density testing, colonoscopy, mammograms, etc.   MAMMOGRAMS:  All women over 40 years old should have a routine mammogram.   COLON CANCER SCREENING: Now recommend starting at age 45. At this time colonoscopy is not covered for routine screening until 50. There are take home tests that can be done between 45-49.   COLONOSCOPY:  Colonoscopy to screen for colon cancer is recommended for all women at age 50.  We know, you hate the idea of the prep.  We agree, BUT, having colon cancer and not knowing it is worse!!  Colon cancer so often starts as a polyp that can be seen and removed at colonscopy, which can quite literally save your life!  And if your first colonoscopy is normal and you have no family history of colon cancer, most women don't have to have it again for 10 years.  Once every ten years, you can do something that may end up saving your life, right?  We will be happy to help you get it scheduled when you are ready.  Be sure to check your insurance coverage so you understand how much it will cost.  It may be covered as a preventative service at no cost, but you should check   your particular policy.      Breast Self-Awareness Breast self-awareness means being familiar with how your breasts look and feel. It involves checking your breasts regularly and reporting any changes to your health care provider. Practicing breast self-awareness is  important. A change in your breasts can be a sign of a serious medical problem. Being familiar with how your breasts look and feel allows you to find any problems early, when treatment is more likely to be successful. All women should practice breast self-awareness, including women who have had breast implants. How to do a breast self-exam One way to learn what is normal for your breasts and whether your breasts are changing is to do a breast self-exam. To do a breast self-exam: Look for Changes  1. Remove all the clothing above your waist. 2. Stand in front of a mirror in a room with good lighting. 3. Put your hands on your hips. 4. Push your hands firmly downward. 5. Compare your breasts in the mirror. Look for differences between them (asymmetry), such as: ? Differences in shape. ? Differences in size. ? Puckers, dips, and bumps in one breast and not the other. 6. Look at each breast for changes in your skin, such as: ? Redness. ? Scaly areas. 7. Look for changes in your nipples, such as: ? Discharge. ? Bleeding. ? Dimpling. ? Redness. ? A change in position. Feel for Changes Carefully feel your breasts for lumps and changes. It is best to do this while lying on your back on the floor and again while sitting or standing in the shower or tub with soapy water on your skin. Feel each breast in the following way:  Place the arm on the side of the breast you are examining above your head.  Feel your breast with the other hand.  Start in the nipple area and make  inch (2 cm) overlapping circles to feel your breast. Use the pads of your three middle fingers to do this. Apply light pressure, then medium pressure, then firm pressure. The light pressure will allow you to feel the tissue closest to the skin. The medium pressure will allow you to feel the tissue that is a little deeper. The firm pressure will allow you to feel the tissue close to the ribs.  Continue the overlapping circles,  moving downward over the breast until you feel your ribs below your breast.  Move one finger-width toward the center of the body. Continue to use the  inch (2 cm) overlapping circles to feel your breast as you move slowly up toward your collarbone.  Continue the up and down exam using all three pressures until you reach your armpit.  Write Down What You Find  Write down what is normal for each breast and any changes that you find. Keep a written record with breast changes or normal findings for each breast. By writing this information down, you do not need to depend only on memory for size, tenderness, or location. Write down where you are in your menstrual cycle, if you are still menstruating. If you are having trouble noticing differences in your breasts, do not get discouraged. With time you will become more familiar with the variations in your breasts and more comfortable with the exam. How often should I examine my breasts? Examine your breasts every month. If you are breastfeeding, the best time to examine your breasts is after a feeding or after using a breast pump. If you menstruate, the best time to   examine your breasts is 5-7 days after your period is over. During your period, your breasts are lumpier, and it may be more difficult to notice changes. When should I see my health care provider? See your health care provider if you notice:  A change in shape or size of your breasts or nipples.  A change in the skin of your breast or nipples, such as a reddened or scaly area.  Unusual discharge from your nipples.  A lump or thick area that was not there before.  Pain in your breasts.  Anything that concerns you.   Breast Tenderness Breast tenderness is a common problem for women of all ages, but may also occur in men. Breast tenderness may range from mild discomfort to severe pain. In women, the pain usually comes and goes with the menstrual cycle, but it can also be  constant. Breast tenderness has many possible causes, including hormone changes, infections, and taking certain medicines. You may have tests, such as a mammogram or an ultrasound, to check for any unusual findings. Having breast tenderness usually does not mean that you have breast cancer. Follow these instructions at home: Managing pain and discomfort  If directed, put ice to the painful area. To do this: ? Put ice in a plastic bag. ? Place a towel between your skin and the bag. ? Leave the ice on for 20 minutes, 2-3 times a day.  Wear a supportive bra, especially during exercise. You may also want to wear a supportive bra while sleeping if your breasts are very tender.   Medicines  Take over-the-counter and prescription medicines only as told by your health care provider. If the cause of your pain is infection, you may be prescribed an antibiotic medicine.  If you were prescribed an antibiotic, take it as told by your health care provider. Do not stop taking the antibiotic even if you start to feel better. Eating and drinking  Your health care provider may recommend that you lessen the amount of fat in your diet. You can do this by: ? Limiting fried foods. ? Cooking foods using methods such as baking, boiling, grilling, and broiling.  Decrease the amount of caffeine in your diet. Instead, drink more water and choose caffeine-free drinks. General instructions  Keep a log of the days and times when your breasts are most tender.  Ask your health care provider how to do breast exams at home. This will help you notice if you have an unusual growth or lump.  Keep all follow-up visits as told by your health care provider. This is important.   Contact a health care provider if:  Any part of your breast is hard, red, and hot to the touch. This may be a sign of infection.  You are a woman and: ? Not breastfeeding and you have fluid, especially blood or pus, coming out of your  nipples. ? Have a new or painful lump in your breast that remains after your menstrual period ends.  You have a fever.  Your pain does not improve or it gets worse.  Your pain is interfering with your daily activities. Summary  Breast tenderness may range from mild discomfort to severe pain.  Breast tenderness has many possible causes, including hormone changes, infections, and taking certain medicines.  It can be treated with ice, wearing a supportive bra, and medicines.  Make changes to your diet if told to by your health care provider. This information is not intended to replace advice  given to you by your health care provider. Make sure you discuss any questions you have with your health care provider. Document Revised: 12/14/2018 Document Reviewed: 12/14/2018 Elsevier Patient Education  2021 [employer].

## 2020-09-29 NOTE — Progress Notes (Signed)
25 y.o. G0P0000 Single Black or African American Not Hispanic or Latino female here for annual exam.   Period Cycle (Days): 21 Period Duration (Days): 4-6 Period Pattern: Regular Menstrual Flow: Heavy,Moderate Menstrual Control: Tampon,Thin pad Menstrual Control Change Freq (Hours): 4 Dysmenorrhea: None  On her heaviest day she can saturate a super tampon in 3 hour, some clots.  Sexually active, same partner x 9 months. Not using condoms.  She c/o bilateral breast tenderness for the last few months, seems to be getting better. She has random, sharp pains in either breast. Recently the left breast has been more than the right breast.   She has a h/o DM, last HgbA1C was ~7.5 (improvement).   Patient's last menstrual period was 09/15/2020.          Sexually active: Yes.    The current method of family planning is Micronor.    Exercising: Yes.    walking on treadmill and elliptical  Smoker:  no  Health Maintenance: Pap:  09/13/19 Neg History of abnormal Pap:  no TDaP:  UTD Gardasil: completed   reports that she has never smoked. She has never used smokeless tobacco. She reports previous alcohol use. She reports that she does not use drugs. No ETOH. Working first shift in a lab.   Past Medical History:  Diagnosis Date  . Allergy    seasonal allergies  . Diabetes mellitus without complication (HCC)   . STD (sexually transmitted disease) 2018   Tx'd for Chlamydia    Past Surgical History:  Procedure Laterality Date  . COLON SURGERY     as an infant    Current Outpatient Medications  Medication Sig Dispense Refill  . glucose blood (ACCU-CHEK AVIVA PLUS) test strip USE AS DIRECTED TO TEST BLOOD SUGAR 6-8 TIMES PER DAY. **PT NEEDS APPT** 300 each 0  . insulin aspart (NOVOLOG FLEXPEN) 100 UNIT/ML FlexPen INJECT SUBCUTANEOUSLY AS DIRECTED UP TO MAX DAILY DOSE OF 75 UNITS **PT NEEDS FOLLOW UP APPT WITH DR GHERGHE** 10 pen 0  . Insulin Glargine (LANTUS SOLOSTAR) 100 UNIT/ML Solostar  Pen Inject 28 Units into the skin daily at 10 pm. **PT NEEDS FOLLOW UP APPT WITH DR GHERGHE** 10 pen 0  . norethindrone (MICRONOR) 0.35 MG tablet Take 1 tablet (0.35 mg total) by mouth daily. 1 Package 11   No current facility-administered medications for this visit.    Family History  Problem Relation Age of Onset  . Diabetes Mother        Type I  . Lupus Mother   . Allergies Father   . Hypertension Maternal Grandmother   . Stroke Maternal Grandmother   . Diabetes Maternal Grandfather   . Glaucoma Paternal Grandfather   . Ovarian cancer Maternal Aunt     Review of Systems  Constitutional: Negative.   HENT: Negative.   Eyes: Negative.   Respiratory: Negative.   Cardiovascular: Negative.   Gastrointestinal: Negative.   Endocrine: Negative.   Genitourinary: Negative.   Musculoskeletal: Negative.   Skin: Negative.   Allergic/Immunologic: Negative.   Neurological: Negative.   Hematological: Negative.   Psychiatric/Behavioral: Negative.     Exam:   BP 132/88 (BP Location: Right Arm, Patient Position: Sitting, Cuff Size: Normal)   Pulse (!) 105   Ht 5' 0.25" (1.53 m)   Wt 153 lb (69.4 kg)   LMP 09/15/2020   SpO2 100%   BMI 29.63 kg/m   Weight change: @WEIGHTCHANGE @ Height:   Height: 5' 0.25" (153 cm)  Ht Readings from  Last 3 Encounters:  09/29/20 5' 0.25" (1.53 m)  01/19/20 4\' 11"  (1.499 m)  09/13/19 4' 11.75" (1.518 m)    General appearance: alert, cooperative and appears stated age Head: Normocephalic, without obvious abnormality, atraumatic Neck: no adenopathy, supple, symmetrical, trachea midline and thyroid normal to inspection and palpation Lungs: clear to auscultation bilaterally Cardiovascular: regular rate and rhythm Breasts: normal appearance, no masses or tenderness Abdomen: soft, non-tender; non distended,  no masses,  no organomegaly Extremities: extremities normal, atraumatic, no cyanosis or edema Skin: Skin color, texture, turgor normal. No rashes  or lesions Lymph nodes: Cervical, supraclavicular, and axillary nodes normal. No abnormal inguinal nodes palpated Neurologic: Grossly normal   Pelvic: External genitalia:  no lesions              Urethra:  normal appearing urethra with no masses, tenderness or lesions              Bartholins and Skenes: normal                 Vagina: normal appearing vagina with normal color and discharge, no lesions              Cervix: no lesions and up under her pubic symphysis                Bimanual Exam:  Uterus:  no masses or tenderness              Adnexa: no mass, fullness, tenderness               Rectovaginal: Confirms               Anus:  normal sphincter tone, no lesions  12-11-1999 chaperoned for the exam.      1. Well woman exam No pap this year Discussed breast self exam Discussed calcium and vit D intake  2. Screening examination for STD (sexually transmitted disease)  - HIV Antibody (routine testing w rflx) - RPR - SURESWAB CT/NG/T. vaginalis  3. Encounter for surveillance of contraceptive pills Doing well on POP - norethindrone (MICRONOR) 0.35 MG tablet; Take 1 tablet (0.35 mg total) by mouth daily.  Dispense: 84 tablet; Refill: 3  4. Breast tenderness Discussed having properly fitting bra's, avoiding caffeine, using ice and over the counter ibuprofen or tylenol as needed Information given She will call if it doesn't improve

## 2020-10-02 LAB — HIV ANTIBODY (ROUTINE TESTING W REFLEX): HIV 1&2 Ab, 4th Generation: NONREACTIVE

## 2020-10-02 LAB — RPR: RPR Ser Ql: NONREACTIVE

## 2020-10-02 LAB — SURESWAB CT/NG/T. VAGINALIS
C. trachomatis RNA, TMA: NOT DETECTED
N. gonorrhoeae RNA, TMA: NOT DETECTED
Trichomonas vaginalis RNA: NOT DETECTED

## 2020-10-31 DIAGNOSIS — R030 Elevated blood-pressure reading, without diagnosis of hypertension: Secondary | ICD-10-CM | POA: Insufficient documentation

## 2020-10-31 DIAGNOSIS — I10 Essential (primary) hypertension: Secondary | ICD-10-CM | POA: Insufficient documentation

## 2020-10-31 DIAGNOSIS — K5901 Slow transit constipation: Secondary | ICD-10-CM | POA: Insufficient documentation

## 2020-11-20 DIAGNOSIS — R221 Localized swelling, mass and lump, neck: Secondary | ICD-10-CM | POA: Insufficient documentation

## 2020-12-24 ENCOUNTER — Encounter: Payer: Self-pay | Admitting: Emergency Medicine

## 2020-12-24 ENCOUNTER — Other Ambulatory Visit: Payer: Self-pay

## 2020-12-24 ENCOUNTER — Ambulatory Visit
Admission: EM | Admit: 2020-12-24 | Discharge: 2020-12-24 | Disposition: A | Discharge status: Home / Self Care | Payer: No Typology Code available for payment source

## 2020-12-24 DIAGNOSIS — L249 Irritant contact dermatitis, unspecified cause: Secondary | ICD-10-CM

## 2020-12-24 MED ORDER — PREDNISONE 10 MG (21) PO TBPK: ORAL_TABLET | Freq: Every day | ORAL | 0 refills | Status: DC

## 2020-12-24 NOTE — ED Triage Notes (Signed)
Patient c/o rash on both hands x 2 weeks.  Patient has applied EchoStar and Cortisone Cream.  Rash is itchy, no new detergents, soaps or lotion.

## 2020-12-24 NOTE — Discharge Instructions (Addendum)
Take medications as prescribed  Moisturize your skin as needed. Put cool cloths on your skin. Put a baking soda paste on your skin. Stir water into baking soda until it looks like a paste. Do not scratch your skin. Avoid having things rub up against your skin. Avoid the use of soaps, perfumes, and dyes. You may take benadryl as needed for itching

## 2020-12-24 NOTE — ED Provider Notes (Signed)
[facility]    CSN: [id number] Arrival date & time: 12/24/20  1238      History   Chief Complaint Chief Complaint  Patient presents with  . Rash    HPI Brittany Dixon is a 25 y.o. female.   Subjective:   Brittany Dixon is a 25 y.o. female who presents for evaluation of rash. Rash started 2 weeks ago after going to a gravesite to plant flowers and spraying her home with a bug repellent. She is unsure which of the two could have caused her symptoms. Initial distribution is to the bilateral hands. Lesions skin colored and are of raised texture. Rash has not changed over time.  Rash is pruritic. Patient denies any sore throat, difficulty swallowing, shortness of breath, fever, headache, irritability or myalgia. Patient has not had previous evaluation of rash. Patient tried OTC ointments and hydrocortisone cream without any relief in symptoms. Patient has not had contacts with similar rash. Patient has not had new exposures.  The following portions of the patient's history were reviewed and updated as appropriate: allergies, current medications, past family history, past medical history, past social history, past surgical history and problem list.        Past Medical History:  Diagnosis Date  . Allergy    seasonal allergies  . Diabetes mellitus without complication (HCC)   . STD (sexually transmitted disease) 2018   Tx'd for Chlamydia    Patient Active Problem List   Diagnosis Date Noted  . Type 1 diabetes mellitus not at goal [facility]) 10/05/2013  . Family history of other cardiovascular diseases(V17.49) 11/13/2009  . Uncontrolled type 1 diabetes mellitus (HCC) 06/01/2003    Past Surgical History:  Procedure Laterality Date  . COLON SURGERY     as an infant    OB History    Gravida  0   Para  0   Term  0   Preterm  0   AB  0   Living  0     SAB  0   IAB  0   Ectopic  0   Multiple  0   Live Births  0            Home Medications     Prior to Admission medications   Medication Sig Start Date End Date Taking? Authorizing Provider  famotidine (PEPCID) 40 MG tablet Take 40 mg by mouth 2 (two) times daily. 12/05/20  Yes [provider]  glucose blood (ACCU-CHEK AVIVA PLUS) test strip USE AS DIRECTED TO TEST BLOOD SUGAR 6-8 TIMES PER DAY. **PT NEEDS APPT** 09/01/15  Yes Carlus Pavlov, MD  insulin aspart (NOVOLOG FLEXPEN) 100 UNIT/ML FlexPen INJECT SUBCUTANEOUSLY AS DIRECTED UP TO MAX DAILY DOSE OF 75 UNITS **PT NEEDS FOLLOW UP APPT WITH DR Elvera Lennox** 02/23/16  Yes Carlus Pavlov, MD  Insulin Glargine (LANTUS SOLOSTAR) 100 UNIT/ML Solostar Pen Inject 28 Units into the skin daily at 10 pm. **PT NEEDS FOLLOW UP APPT WITH DR Elvera Lennox** 02/23/16  Yes Carlus Pavlov, MD  norethindrone (MICRONOR) 0.35 MG tablet Take 1 tablet (0.35 mg total) by mouth daily. 09/29/20  Yes Romualdo Bolk, MD  predniSONE (STERAPRED UNI-PAK 21 TAB) 10 MG (21) TBPK tablet Take by mouth daily. Take 6 tabs by mouth daily  for 2 days, then 5 tabs for 2 days, then 4 tabs for 2 days, then 3 tabs for 2 days, 2 tabs for 2 days, then 1 tab by mouth daily for 2 days 12/24/20  Yes Aizah Gehlhausen,  Lelon Mast, FNP    Family History Family History  Problem Relation Age of Onset  . Diabetes Mother        Type I  . Lupus Mother   . Allergies Father   . Hypertension Maternal Grandmother   . Stroke Maternal Grandmother   . Diabetes Maternal Grandfather   . Glaucoma Paternal Grandfather   . Ovarian cancer Maternal Aunt     Social History Social History   Tobacco Use  . Smoking status: Never Smoker  . Smokeless tobacco: Never Used  Vaping Use  . Vaping Use: Never used  Substance Use Topics  . Alcohol use: Not Currently  . Drug use: Never     Allergies   Patient has no known allergies.   Review of Systems Review of Systems  Constitutional: Negative for fever.  HENT: Negative.   Eyes: Negative.   Gastrointestinal: Negative for nausea and  vomiting.  Musculoskeletal: Negative for myalgias.  Skin: Positive for rash.  All other systems reviewed and are negative.    Physical Exam Triage Vital Signs ED Triage Vitals  Enc Vitals Group     BP 12/24/20 1439 (!) 153/87     Pulse Rate 12/24/20 1439 (!) 119     Resp --      Temp 12/24/20 1439 98.7 F (37.1 C)     Temp Source 12/24/20 1439 Oral     SpO2 12/24/20 1439 98 %     Weight --      Height --      Head Circumference --      Peak Flow --      Pain Score 12/24/20 1441 7     Pain Loc --      Pain Edu? --      Excl. in GC? --    No data found.  Updated Vital Signs BP (!) 153/87 (BP Location: Left Arm)   Pulse (!) 119   Temp 98.7 F (37.1 C) (Oral)   LMP 11/23/2020 (Exact Date)   SpO2 98%   Visual Acuity Right Eye Distance:   Left Eye Distance:   Bilateral Distance:    Right Eye Near:   Left Eye Near:    Bilateral Near:     Physical Exam Vitals reviewed.  Constitutional:      General: She is not in acute distress.    Appearance: Normal appearance. She is not ill-appearing, toxic-appearing or diaphoretic.  HENT:     Head: Normocephalic.  Pulmonary:     Effort: Pulmonary effort is normal.  Musculoskeletal:        General: Normal range of motion.     Cervical back: Normal range of motion and neck supple.  Skin:    General: Skin is warm.     Findings: Rash present.          Comments: Skin colored, raised non-pustular rash noted to the dorsal aspect of bilateral hands. No drainage. No erythema. No swelling   Neurological:     General: No focal deficit present.     Mental Status: She is alert and oriented to person, place, and time.  Psychiatric:        Mood and Affect: Mood normal.        Behavior: Behavior normal.      UC Treatments / Results  Labs (all labs ordered are listed, but only abnormal results are displayed) Labs Reviewed - No data to display  EKG   Radiology No results found.  Procedures Procedures (including critical  care time)  Medications Ordered in UC Medications - No data to display  Initial Impression / Assessment and Plan / UC Course  I have reviewed the triage vital signs and the nursing notes.  Pertinent labs & imaging results that were available during my care of the patient were reviewed by me and considered in my medical decision making (see chart for details).    25 year old female presenting with a rash to her hands.  Ongoing for the past couple of weeks.  Unsure etiology of rash as symptoms started after she was planting flowers at a grave site and spray in her apartment with a bug repellent.  Rash has not changed much over time but is not getting better.  It is itchy.  She denies any other symptoms.  Will treat with a course of steroids for contact dermatitis. Benadryl prn for itching. Skin moisturizer. Watch for signs of fever or worsening of the rash.  Follow-up as needed  Today's evaluation has revealed no signs of a dangerous process. Discussed diagnosis with patient and/or guardian. Patient and/or guardian aware of their diagnosis, possible red flag symptoms to watch out for and need for close follow up. Patient and/or guardian understands verbal and written discharge instructions. Patient and/or guardian comfortable with plan and disposition.  Patient and/or guardian has a clear mental status at this time, good insight into illness (after discussion and teaching) and has clear judgment to make decisions regarding their care  This care was provided during an unprecedented National Emergency due to the Novel Coronavirus (COVID-19) pandemic. COVID-19 infections and transmission risks place heavy strains on healthcare resources.  As this pandemic evolves, our facility, providers, and staff strive to respond fluidly, to remain operational, and to provide care relative to available resources and information. Outcomes are unpredictable and treatments are without well-defined guidelines. Further, the  impact of COVID-19 on all aspects of urgent care, including the impact to patients seeking care for reasons other than COVID-19, is unavoidable during this national emergency. At this time of the global pandemic, management of patients has significantly changed, even for non-COVID positive patients given high local and regional COVID volumes at this time requiring high healthcare system and resource utilization. The standard of care for management of both COVID suspected and non-COVID suspected patients continues to change rapidly at the local, regional, national, and global levels. This patient was worked up and treated to the best available but ever changing evidence and resources available at this current time.   Documentation was completed with the aid of voice recognition software. Transcription may contain typographical errors. Final Clinical Impressions(s) / UC Diagnoses   Final diagnoses:  Irritant contact dermatitis, unspecified trigger     Discharge Instructions      Take medications as prescribed   Moisturize your skin as needed.  Put cool cloths on your skin.  Put a baking soda paste on your skin. Stir water into baking soda until it looks like a paste.  Do not scratch your skin.  Avoid having things rub up against your skin.  Avoid the use of soaps, perfumes, and dyes.  You may take benadryl as needed for itching     ED Prescriptions    Medication Sig Dispense Auth. Provider   predniSONE (STERAPRED UNI-PAK 21 TAB) 10 MG (21) TBPK tablet Take by mouth daily. Take 6 tabs by mouth daily  for 2 days, then 5 tabs for 2 days, then 4 tabs for 2 days, then 3 tabs for 2 days,  2 tabs for 2 days, then 1 tab by mouth daily for 2 days 42 tablet Lurline IdolMurrill, Mily Malecki, FNP     PDMP not reviewed this encounter.   Lurline IdolMurrill, Nohely Whitehorn, [place]NP 12/24/20 1524

## 2021-03-22 DIAGNOSIS — Z8761 Personal history of (corrected) necrotizing enterocolitis of newborn: Secondary | ICD-10-CM | POA: Insufficient documentation

## 2021-06-24 ENCOUNTER — Ambulatory Visit (HOSPITAL_COMMUNITY)
Admission: EM | Admit: 2021-06-24 | Discharge: 2021-06-24 | Disposition: A | Discharge status: Home / Self Care | Payer: Managed Care, Other (non HMO) | Attending: Student | Admitting: Student

## 2021-06-24 ENCOUNTER — Other Ambulatory Visit: Payer: Self-pay

## 2021-06-24 ENCOUNTER — Encounter (HOSPITAL_COMMUNITY): Payer: Self-pay

## 2021-06-24 DIAGNOSIS — Z113 Encounter for screening for infections with a predominantly sexual mode of transmission: Secondary | ICD-10-CM | POA: Diagnosis present

## 2021-06-24 LAB — POC URINE PREG, ED: Preg Test, Ur: NEGATIVE

## 2021-06-24 LAB — HIV ANTIBODY (ROUTINE TESTING W REFLEX): HIV Screen 4th Generation wRfx: NONREACTIVE

## 2021-06-24 NOTE — ED Triage Notes (Signed)
Pt presents for STD testing. Denies sxs.  °

## 2021-06-24 NOTE — Discharge Instructions (Addendum)
-  We have sent testing for sexually transmitted infections. We will notify you of any positive results once they are received. If required, we will prescribe any medications you might need. Please refrain from all sexual activity until treatment is complete.  -Seek additional medical attention if you develop fevers/chills, new/worsening abdominal pain, new/worsening vaginal discomfort/discharge, etc.   

## 2021-06-24 NOTE — ED Provider Notes (Signed)
[facility]    CSN: [id number] Arrival date & time: 06/24/21  1409      History   Chief Complaint Chief Complaint  Patient presents with   SEXUALLY TRANSMITTED DISEASE    HPI Brittany Dixon is a 25 y.o. female presenting for STI testing, asymptomatic. Medical history Type 1 diabetes.  Denies symptoms, she is monogamous but unsure of her female partner is. Denies hematuria, dysuria, frequency, urgency, back pain, n/v/d/abd pain, fevers/chills, abdnormal vaginal discharge.    HPI  Past Medical History:  Diagnosis Date   Allergy    seasonal allergies   Diabetes mellitus without complication (HCC)    STD (sexually transmitted disease) 2018   Tx'd for Chlamydia    Patient Active Problem List   Diagnosis Date Noted   Type 1 diabetes mellitus not at goal [facility]) 10/05/2013   Family history of other cardiovascular diseases(V17.49) 11/13/2009   Uncontrolled type 1 diabetes mellitus 06/01/2003    Past Surgical History:  Procedure Laterality Date   COLON SURGERY     as an infant    OB History     Gravida  0   Para  0   Term  0   Preterm  0   AB  0   Living  0      SAB  0   IAB  0   Ectopic  0   Multiple  0   Live Births  0            Home Medications    Prior to Admission medications   Medication Sig Start Date End Date Taking? Authorizing Provider  famotidine (PEPCID) 40 MG tablet Take 40 mg by mouth 2 (two) times daily. 12/05/20   [provider]  glucose blood (ACCU-CHEK AVIVA PLUS) test strip USE AS DIRECTED TO TEST BLOOD SUGAR 6-8 TIMES PER DAY. **PT NEEDS APPT** 09/01/15   Carlus Pavlov, MD  insulin aspart (NOVOLOG FLEXPEN) 100 UNIT/ML FlexPen INJECT SUBCUTANEOUSLY AS DIRECTED UP TO MAX DAILY DOSE OF 75 UNITS **PT NEEDS FOLLOW UP APPT WITH DR Elvera Lennox** 02/23/16   Carlus Pavlov, MD  Insulin Glargine (LANTUS SOLOSTAR) 100 UNIT/ML Solostar Pen Inject 28 Units into the skin daily at 10 pm. **PT NEEDS FOLLOW UP APPT WITH DR  Elvera Lennox** 02/23/16   Carlus Pavlov, MD  norethindrone (MICRONOR) 0.35 MG tablet Take 1 tablet (0.35 mg total) by mouth daily. 09/29/20   Romualdo Bolk, MD  predniSONE (STERAPRED UNI-PAK 21 TAB) 10 MG (21) TBPK tablet Take by mouth daily. Take 6 tabs by mouth daily  for 2 days, then 5 tabs for 2 days, then 4 tabs for 2 days, then 3 tabs for 2 days, 2 tabs for 2 days, then 1 tab by mouth daily for 2 days 12/24/20   Lurline Idol, FNP    Family History Family History  Problem Relation Age of Onset   Diabetes Mother        Type I   Lupus Mother    Allergies Father    Hypertension Maternal Grandmother    Stroke Maternal Grandmother    Diabetes Maternal Grandfather    Glaucoma Paternal Grandfather    Ovarian cancer Maternal Aunt     Social History Social History   Tobacco Use   Smoking status: Never   Smokeless tobacco: Never  Vaping Use   Vaping Use: Never used  Substance Use Topics   Alcohol use: Not Currently   Drug use: Never     Allergies  Patient has no known allergies.   Review of Systems Review of Systems  Constitutional:  Negative for chills and fever.  HENT:  Negative for sore throat.   Eyes:  Negative for pain and redness.  Respiratory:  Negative for shortness of breath.   Cardiovascular:  Negative for chest pain.  Gastrointestinal:  Negative for abdominal pain, diarrhea, nausea and vomiting.  Genitourinary:  Negative for decreased urine volume, difficulty urinating, dysuria, flank pain, frequency, genital sores, hematuria and urgency.  Musculoskeletal:  Negative for back pain.  Skin:  Negative for rash.  All other systems reviewed and are negative.   Physical Exam Triage Vital Signs ED Triage Vitals  Enc Vitals Group     BP 06/24/21 1539 (!) 154/96     Pulse Rate 06/24/21 1539 (!) 107     Resp 06/24/21 1539 17     Temp 06/24/21 1539 98.3 F (36.8 C)     Temp Source 06/24/21 1539 Oral     SpO2 06/24/21 1539 99 %     Weight --       Height --      Head Circumference --      Peak Flow --      Pain Score 06/24/21 1540 0     Pain Loc --      Pain Edu? --      Excl. in GC? --    No data found.  Updated Vital Signs BP (!) 154/96 (BP Location: Right Arm)   Pulse (!) 107   Temp 98.3 F (36.8 C) (Oral)   Resp 17   LMP 06/16/2021 (Exact Date)   SpO2 99%   Visual Acuity Right Eye Distance:   Left Eye Distance:   Bilateral Distance:    Right Eye Near:   Left Eye Near:    Bilateral Near:     Physical Exam Vitals reviewed.  Constitutional:      General: She is not in acute distress.    Appearance: Normal appearance. She is not ill-appearing.  HENT:     Head: Normocephalic and atraumatic.     Mouth/Throat:     Mouth: Mucous membranes are moist.     Comments: Moist mucous membranes Eyes:     Extraocular Movements: Extraocular movements intact.     Pupils: Pupils are equal, round, and reactive to light.  Cardiovascular:     Rate and Rhythm: Regular rhythm. Tachycardia present.     Heart sounds: Normal heart sounds.  Pulmonary:     Effort: Pulmonary effort is normal.     Breath sounds: Normal breath sounds. No wheezing, rhonchi or rales.  Abdominal:     General: Bowel sounds are normal. There is no distension.     Palpations: Abdomen is soft. There is no mass.     Tenderness: There is no abdominal tenderness. There is no right CVA tenderness, left CVA tenderness, guarding or rebound.  Skin:    General: Skin is warm.     Capillary Refill: Capillary refill takes less than 2 seconds.     Comments: Good skin turgor  Neurological:     General: No focal deficit present.     Mental Status: She is alert and oriented to person, place, and time.  Psychiatric:        Mood and Affect: Mood normal.        Behavior: Behavior normal.     UC Treatments / Results  Labs (all labs ordered are listed, but only abnormal results are displayed) Labs  Reviewed  RPR  HIV ANTIBODY (ROUTINE TESTING W REFLEX)  POC URINE  PREG, ED  CERVICOVAGINAL ANCILLARY ONLY    EKG   Radiology No results found.  Procedures Procedures (including critical care time)  Medications Ordered in UC Medications - No data to display  Initial Impression / Assessment and Plan / UC Course  I have reviewed the triage vital signs and the nursing notes.  Pertinent labs & imaging results that were available during my care of the patient were reviewed by me and considered in my medical decision making (see chart for details).     This patient is a very pleasant 25 y.o. year old female presenting for STI screen. Afebrile, no reproducible abd pain or CVAT. Tachy at 107, pt is typically tachycardic per chart review. Asymptomatic STI screen.  Will send self-swab for G/C, trich, yeast, BV testing. Also sent HIV, RPR. Safe sex precautions.   ED return precautions discussed. Patient verbalizes understanding and agreement.   Final Clinical Impressions(s) / UC Diagnoses   Final diagnoses:  Routine screening for STI (sexually transmitted infection)     Discharge Instructions      -We have sent testing for sexually transmitted infections. We will notify you of any positive results once they are received. If required, we will prescribe any medications you might need. Please refrain from all sexual activity until treatment is complete.  -Seek additional medical attention if you develop fevers/chills, new/worsening abdominal pain, new/worsening vaginal discomfort/discharge, etc.       ED Prescriptions   None    PDMP not reviewed this encounter.   Hazel Sams, PA-C 06/24/21 1625

## 2021-06-25 LAB — CERVICOVAGINAL ANCILLARY ONLY
Bacterial Vaginitis (gardnerella): NEGATIVE
Candida Glabrata: NEGATIVE
Candida Vaginitis: POSITIVE — AB
Chlamydia: NEGATIVE
Comment: NEGATIVE
Comment: NEGATIVE
Comment: NEGATIVE
Comment: NEGATIVE
Comment: NEGATIVE
Comment: NORMAL
Neisseria Gonorrhea: NEGATIVE
Trichomonas: NEGATIVE

## 2021-06-25 LAB — RPR: RPR Ser Ql: NONREACTIVE

## 2021-06-26 ENCOUNTER — Telehealth (HOSPITAL_COMMUNITY): Payer: Self-pay | Admitting: Emergency Medicine

## 2021-06-26 MED ORDER — FLUCONAZOLE 150 MG PO TABS: 150.0000 mg | ORAL_TABLET | Freq: Once | ORAL | 0 refills | Status: AC

## 2021-10-08 ENCOUNTER — Encounter (HOSPITAL_COMMUNITY): Payer: Self-pay

## 2021-10-08 ENCOUNTER — Ambulatory Visit (INDEPENDENT_AMBULATORY_CARE_PROVIDER_SITE_OTHER): Payer: Managed Care, Other (non HMO)

## 2021-10-08 ENCOUNTER — Ambulatory Visit (HOSPITAL_COMMUNITY)
Admission: EM | Admit: 2021-10-08 | Discharge: 2021-10-08 | Disposition: A | Discharge status: Home / Self Care | Payer: Managed Care, Other (non HMO) | Attending: Sports Medicine | Admitting: Sports Medicine

## 2021-10-08 ENCOUNTER — Other Ambulatory Visit: Payer: Self-pay

## 2021-10-08 DIAGNOSIS — W19XXXA Unspecified fall, initial encounter: Secondary | ICD-10-CM

## 2021-10-08 DIAGNOSIS — M25562 Pain in left knee: Secondary | ICD-10-CM

## 2021-10-08 DIAGNOSIS — S82122A Displaced fracture of lateral condyle of left tibia, initial encounter for closed fracture: Secondary | ICD-10-CM

## 2021-10-08 DIAGNOSIS — M25462 Effusion, left knee: Secondary | ICD-10-CM

## 2021-10-08 DIAGNOSIS — M238X2 Other internal derangements of left knee: Secondary | ICD-10-CM

## 2021-10-08 NOTE — ED Provider Notes (Signed)
?[facility] ? ? ? ?CSN: [id number] ?Arrival date & time: 10/08/21  4696 ? ? ?  ? ?History   ?Chief Complaint ?Chief Complaint  ?Patient presents with  ? Fall  ? ? ?Brittany Dixon is a 26 y.o. female here for left knee pain. ? ? ?Fall ? ? ?Patient was walking around the house last night when she tripped over some boards and fell to the left side.  She denied feeling any pop or immediate pain but believes the left knee fell in a twisting like motion.  She fell onto the left lateral aspect of the knee.  Did not have immediate pain, although when she went to put pressure and walk she was having sniffing and pain.  She has noticed some swelling of the left knee.  Reports having pain on the lateral aspect of the knee and in the posterior fossa.  He denies prior injury to this knee.  She has been applying ice to the knee.  No medications.  Does walk with antalgic gait, pain is worse with movement and ambulation.  Denies any clicking or catching currently. ? ?Past Medical History:  ?Diagnosis Date  ? Allergy   ? seasonal allergies  ? Diabetes mellitus without complication (HCC)   ? STD (sexually transmitted disease) 2018  ? Tx'd for Chlamydia  ? ? ?Patient Active Problem List  ? Diagnosis Date Noted  ? Type 1 diabetes mellitus not at goal [facility]) 10/05/2013  ? Family history of other cardiovascular diseases(V17.49) 11/13/2009  ? Uncontrolled type 1 diabetes mellitus 06/01/2003  ? ? ?Past Surgical History:  ?Procedure Laterality Date  ? COLON SURGERY    ? as an infant  ? ? ?OB History   ? ? Gravida  ?0  ? Para  ?0  ? Term  ?0  ? Preterm  ?0  ? AB  ?0  ? Living  ?0  ?  ? ? SAB  ?0  ? IAB  ?0  ? Ectopic  ?0  ? Multiple  ?0  ? Live Births  ?0  ?   ?  ?  ? ? ? ?Home Medications   ? ?Prior to Admission medications   ?Medication Sig Start Date End Date Taking? Authorizing Provider  ?famotidine (PEPCID) 40 MG tablet Take 40 mg by mouth 2 (two) times daily. 12/05/20   [provider]  ?glucose blood (ACCU-CHEK AVIVA  PLUS) test strip USE AS DIRECTED TO TEST BLOOD SUGAR 6-8 TIMES PER DAY. **PT NEEDS APPT** 09/01/15   Carlus Pavlov, MD  ?insulin aspart (NOVOLOG FLEXPEN) 100 UNIT/ML FlexPen INJECT SUBCUTANEOUSLY AS DIRECTED UP TO MAX DAILY DOSE OF 75 UNITS **PT NEEDS FOLLOW UP APPT WITH DR Elvera Lennox** 02/23/16   Carlus Pavlov, MD  ?Insulin Glargine (LANTUS SOLOSTAR) 100 UNIT/ML Solostar Pen Inject 28 Units into the skin daily at 10 pm. **PT NEEDS FOLLOW UP APPT WITH DR Elvera Lennox** 02/23/16   Carlus Pavlov, MD  ?norethindrone (MICRONOR) 0.35 MG tablet Take 1 tablet (0.35 mg total) by mouth daily. 09/29/20   Romualdo Bolk, MD  ? ? ?Family History ?Family History  ?Problem Relation Age of Onset  ? Diabetes Mother   ?     Type I  ? Lupus Mother   ? Allergies Father   ? Hypertension Maternal Grandmother   ? Stroke Maternal Grandmother   ? Diabetes Maternal Grandfather   ? Glaucoma Paternal Grandfather   ? Ovarian cancer Maternal Aunt   ? ? ?Social History ?Social History  ? ?  Tobacco Use  ? Smoking status: Never  ? Smokeless tobacco: Never  ?Vaping Use  ? Vaping Use: Never used  ?Substance Use Topics  ? Alcohol use: Not Currently  ? Drug use: Never  ? ? ? ?Allergies   ?Patient has no known allergies. ? ? ?Review of Systems ?Review of Systems  ?Constitutional:  Negative for chills and fever.  ?Musculoskeletal:  Positive for arthralgias (left knee pain), gait problem and joint swelling.  ?Skin:  Negative for rash and wound.  ? ? ?Physical Exam ?Triage Vital Signs ?ED Triage Vitals  ?Enc Vitals Group  ?   BP 10/08/21 0945 (!) 165/102  ?   Pulse Rate 10/08/21 0945 (!) 110  ?   Resp 10/08/21 0945 18  ?   Temp 10/08/21 0945 98.2 ?F (36.8 ?C)  ?   Temp Source 10/08/21 0945 Oral  ?   SpO2 10/08/21 0945 99 %  ?   Weight --   ?   Height --   ?   Head Circumference --   ?   Peak Flow --   ?   Pain Score 10/08/21 0946 0  ?   Pain Loc --   ?   Pain Edu? --   ?   Excl. in GC? --   ? ?No data found. ? ?Updated Vital Signs ?BP (!) 165/102 (BP  Location: Left Arm)   Pulse (!) 110   Temp 98.2 ?F (36.8 ?C) (Oral)   Resp 18   LMP 09/19/2021   SpO2 99%  ? ?Physical Exam ?Gen: Well-appearing, in no acute distress; non-toxic ?CV: Regular Rate. Well-perfused. Warm.  ?Resp: Breathing unlabored on room air; no wheezing. ?Psych: Fluid speech in conversation; appropriate affect; normal thought process ?Neuro: Sensation intact throughout. No gross coordination deficits.  ?MSK:  ? ?- Left knee: Inspection yields a mild knee effusion.  There is no erythema or ecchymosis or bony abnormality noted. + TTP over the lateral joint line and over the fibular head.  Range of motion from 0-120 degrees, with pain towards end range flexion.  Patellar and quadriceps tendons unremarkable.  Able to perform straight leg raise.  Knee strength deferred secondary to pain.  Neurovascular intact distally.  Negative patellar grind/compression. + Lachman's test with laxity; Negative Posterior drawer, guarding present with anterior drawer. There is no laxity or gapping with varus or valgus stress at 0 and 30 degrees.  Remainder of provocative testing somewhat difficult due to patient guarding.  Neurovascular intact distally. ? ? ? ?UC Treatments / Results  ?Labs ?(all labs ordered are listed, but only abnormal results are displayed) ?Labs Reviewed - No data to display ? ?EKG ? ? ?Radiology ?DG Knee Complete 4 Views Left ? ?Result Date: 10/08/2021 ?CLINICAL DATA:  fall, knee effusion, twisting motion EXAM: LEFT KNEE - COMPLETE 4+ VIEW COMPARISON:  None. FINDINGS: There is a small avulsion fracture at the periphery of the lateral tibial plateau. Probable small joint effusion. IMPRESSION: Small avulsion fracture at the periphery of the lateral tibial plateau with probable small joint effusion. This fracture is often associated with internal derangement of the knee. Recommend MRI. Electronically Signed   By: Caprice Renshaw M.D.   On: 10/08/2021 10:37   ? ?Procedures ?Procedures (including critical  care time) ? ?Medications Ordered in UC ?Medications - No data to display ? ?Initial Impression / Assessment and Plan / UC Course  ?I have reviewed the triage vital signs and the nursing notes. ? ?Pertinent labs & imaging results that  were available during my care of the patient were reviewed by me and considered in my medical decision making (see chart for details). ? ?  ? ?Left lateral knee pain status post fall ?Knee effusion, left ?Avulsion fracture of the left lateral tibial plateau ?Positive Lachman test, concern for likely ACL tear of left knee ? ?Patient had a fall yesterday with a twisting motion of the knee.  Now with knee effusion, pain and difficulty ambulating.  X-rays demonstrated a small avulsion fracture at the lateral aspect of the tibial plateau.  Physical exam demonstrates effusion and laxity with Lachman test, I am concerned about concomitant ACL tear.  Patient was fitted for hinged knee brace and provided crutches for ambulation.  She is to ice multiple times a day, may take over-the-counter anti-inflammatories.  Local orthopedic office information provided, patient needs to call to set up appointment with likely need for MRI to evaluate for internal derangement.  All questions answered. ?Final Clinical Impressions(s) / UC Diagnoses  ? ?Final diagnoses:  ?Acute pain of left knee  ?Knee effusion, left  ?Closed avulsion fracture of lateral condyle of left tibia, initial encounter  ?ACL laxity, left  ? ? ? ?Discharge Instructions   ? ?  ?O[place], ? ?You have a fracture of the knee, of the lateral tibial plateau.  Given this in mind the examination, I am concerned about an ACL tear.  ? ?Ice the knee for 20 minutes at least 3 times daily.  You may take over-the-counter anti-inflammatories. ? ?Call the orthopedic office to have them see you this week, likely will need an MRI of the knee. ? ? ? ? ?ED Prescriptions   ?None ?  ? ?PDMP not reviewed this encounter. ?  Madelyn Brunner, DO ?10/08/21 1111 ? ?

## 2021-10-08 NOTE — ED Triage Notes (Signed)
Pt states tripped and fell last night on hardwood floors. C/o LLE pain with swelling. ?

## 2021-10-08 NOTE — Discharge Instructions (Addendum)
Brittany Dixon, ? ?You have a fracture of the knee, of the lateral tibial plateau.  Given this in mind the examination, I am concerned about an ACL tear.  ? ?Ice the knee for 20 minutes at least 3 times daily.  You may take over-the-counter anti-inflammatories. ? ?Call the orthopedic office to have them see you this week, likely will need an MRI of the knee. ?

## 2021-10-10 ENCOUNTER — Other Ambulatory Visit: Payer: Self-pay

## 2021-10-10 ENCOUNTER — Ambulatory Visit (HOSPITAL_BASED_OUTPATIENT_CLINIC_OR_DEPARTMENT_OTHER): Payer: Managed Care, Other (non HMO) | Admitting: Orthopaedic Surgery

## 2021-10-10 DIAGNOSIS — M25562 Pain in left knee: Secondary | ICD-10-CM

## 2021-10-10 DIAGNOSIS — M2392 Unspecified internal derangement of left knee: Secondary | ICD-10-CM

## 2021-10-10 NOTE — Progress Notes (Signed)
? ?                            ? ? ?Chief Complaint: Left knee pain ?  ? ? ?History of Present Illness:  ? ? ?Brittany Dixon is a 26 y.o. female presents with left knee pain after she tripped over a heater on October 08, 2021.  She initially presented to the [facility] urgent care and was found to have a Segond fracture on x-ray.  She was sent here for further management.  She does have swelling in the left knee.  She states that she has not walked with crutches as she is having a difficult time putting weight on the left leg.  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  She is having persistent knee swelling and feeling like the knee is giving out for which she is using an over-the-counter knee brace. ? ? ? ?Surgical History:   ?None ? ?PMH/PSH/Family History/Social History/Meds/Allergies:   ? ?Past Medical History:  ?Diagnosis Date  ? Allergy   ? seasonal allergies  ? Diabetes mellitus without complication (HCC)   ? STD (sexually transmitted disease) 2018  ? Tx'd for Chlamydia  ? ?Past Surgical History:  ?Procedure Laterality Date  ? COLON SURGERY    ? as an infant  ? ?Social History  ? ?Socioeconomic History  ? Marital status: Single  ?  Spouse name: Not on file  ? Number of children: Not on file  ? Years of education: Not on file  ? Highest education level: Not on file  ?Occupational History  ? Not on file  ?Tobacco Use  ? Smoking status: Never  ? Smokeless tobacco: Never  ?Vaping Use  ? Vaping Use: Never used  ?Substance and Sexual Activity  ? Alcohol use: Not Currently  ? Drug use: Never  ? Sexual activity: Yes  ?  Partners: Male  ?  Birth control/protection: Pill  ?Other Topics Concern  ? Not on file  ?Social History Narrative  ? Not on file  ? ?Social Determinants of Health  ? ?Financial Resource Strain: Not on file  ?Food Insecurity: Not on file  ?Transportation Needs: Not on file  ?Physical Activity: Not on file  ?Stress: Not on file  ?Social Connections: Not on file  ? ?Family History  ?Problem  Relation Age of Onset  ? Diabetes Mother   ?     Type I  ? Lupus Mother   ? Allergies Father   ? Hypertension Maternal Grandmother   ? Stroke Maternal Grandmother   ? Diabetes Maternal Grandfather   ? Glaucoma Paternal Grandfather   ? Ovarian cancer Maternal Aunt   ? ?No Known Allergies ?Current Outpatient Medications  ?Medication Sig Dispense Refill  ? famotidine (PEPCID) 40 MG tablet Take 40 mg by mouth 2 (two) times daily.    ? glucose blood (ACCU-CHEK AVIVA PLUS) test strip USE AS DIRECTED TO TEST BLOOD SUGAR 6-8 TIMES PER DAY. **PT NEEDS APPT** 300 each 0  ? insulin aspart (NOVOLOG FLEXPEN) 100 UNIT/ML FlexPen INJECT SUBCUTANEOUSLY AS DIRECTED UP TO MAX DAILY DOSE OF 75 UNITS **PT NEEDS FOLLOW UP APPT WITH DR GHERGHE** 10 pen 0  ? Insulin Glargine (LANTUS SOLOSTAR) 100 UNIT/ML Solostar Pen Inject 28 Units into the skin daily at 10 pm. **PT NEEDS FOLLOW UP APPT WITH DR GHERGHE** 10 pen 0  ? norethindrone (MICRONOR) 0.35 MG tablet Take 1 tablet (0.35  mg total) by mouth daily. 84 tablet 3  ? ?No current facility-administered medications for this visit.  ? ?No results found. ? ?Review of Systems:   ?A ROS was performed including pertinent positives and negatives as documented in the HPI. ? ?Physical Exam :   ?Constitutional: NAD and appears stated age ?Neurological: Alert and oriented ?Psych: Appropriate affect and cooperative ?Last menstrual period 09/19/2021.  ? ?Comprehensive Musculoskeletal Exam:   ? ?  ?Musculoskeletal Exam  ?Gait Normal  ?Alignment Normal  ? Right Left  ?Inspection Normal Normal  ?Palpation    ?Tenderness None Medial joint  ?Crepitus None None  ?Effusion None Moderate  ?Range of Motion    ?Extension 0 0  ?Flexion 135 90  ?Strength    ?Extension 5/5 5/5  ?Flexion 5/5 5/5  ?Ligament Exam     ?Generalized Laxity No No  ?Lachman Negative Positive  ?Pivot Shift Negative Negative  ?Anterior Drawer Negative Positive  ?Valgus at 0 Negative Negative  ?Valgus at 20 Negative Negative  ?Varus at 0 0 0   ?Varus at 20   0 0  ?Posterior Drawer at 90 0 0  ?Vascular/Lymphatic Exam    ?Edema None None  ?Venous Stasis Changes No No  ?Distal Circulation Normal Normal  ?Neurologic    ?Light Touch Sensation Intact Intact  ?Special Tests:   ? ? ? ?Imaging:   ?Xray (4 views left knee): ?Left knee reverse Segond fracture involving the left knee ? ? ?I personally reviewed and interpreted the radiographs. ? ? ?Assessment:   ?26 year old female with left knee pain swelling and effusion with difficulty weightbearing after she tripped and fell over a heater.  At this time x-rays do show a reverse Segond fracture and examination is consistent with a ACL tear.  At this time I would like to order an MRI so that we can further assess and rule out underlying ACL injury ? ?Plan :   ? ?-MRI ordered in the clinic following to discuss results ? ? ?I believe that advance imaging in the form of an MRI is indicated for the following reasons: ?-Xrays images were obtained and not diagnostic ?-The patient has failed treatment modalities including rest, activity modification ?-The following worrisome symptoms are present on history and exam: Effusion and difficulty weightbearing with x-ray findings consistent with ACL tear which would require acute intervention ? ? ? ? ? ? ?I personally saw and evaluated the patient, and participated in the management and treatment plan. ? ?Huel Cote, MD ?Attending Physician, Orthopedic Surgery ? ?This document was dictated using Conservation officer, historic buildings. A reasonable attempt at proof reading has been made to minimize errors. ?

## 2021-10-12 ENCOUNTER — Ambulatory Visit
Admission: RE | Admit: 2021-10-12 | Discharge: 2021-10-12 | Disposition: A | Discharge status: Home / Self Care | Payer: Managed Care, Other (non HMO) | Source: Ambulatory Visit | Attending: Orthopaedic Surgery | Admitting: Orthopaedic Surgery

## 2021-10-12 ENCOUNTER — Other Ambulatory Visit: Payer: Self-pay

## 2021-10-12 DIAGNOSIS — M25562 Pain in left knee: Secondary | ICD-10-CM

## 2021-10-26 ENCOUNTER — Ambulatory Visit (HOSPITAL_BASED_OUTPATIENT_CLINIC_OR_DEPARTMENT_OTHER): Payer: Managed Care, Other (non HMO) | Admitting: Orthopaedic Surgery

## 2021-11-02 ENCOUNTER — Ambulatory Visit (HOSPITAL_BASED_OUTPATIENT_CLINIC_OR_DEPARTMENT_OTHER): Payer: Self-pay | Admitting: Orthopaedic Surgery

## 2021-11-02 ENCOUNTER — Ambulatory Visit (HOSPITAL_BASED_OUTPATIENT_CLINIC_OR_DEPARTMENT_OTHER): Payer: Managed Care, Other (non HMO) | Admitting: Orthopaedic Surgery

## 2021-11-02 DIAGNOSIS — M25562 Pain in left knee: Secondary | ICD-10-CM | POA: Diagnosis not present

## 2021-11-02 DIAGNOSIS — S83512A Sprain of anterior cruciate ligament of left knee, initial encounter: Secondary | ICD-10-CM

## 2021-11-02 DIAGNOSIS — M2392 Unspecified internal derangement of left knee: Secondary | ICD-10-CM

## 2021-11-02 NOTE — Progress Notes (Signed)
? ?                            ? ? ?Chief Complaint: Left knee pain ?  ? ? ?History of Present Illness:  ? ?11/02/2021: Presents today for follow-up of her left knee.  She continues to have pain and instability.  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  That being said the knee continues to feel painful and unstable.  She is very hesitant to go up and down stairs at this point.  Here today for further MRI discussion. ? ? ?Brittany Dixon is a 26 y.o. female presents with left knee pain after she tripped over a heater on October 08, 2021.  She initially presented to the [facility] urgent care and was found to have a Segond fracture on x-ray.  She was sent here for further management.  She does have swelling in the left knee.  She states that she has not walked with crutches as she is having a difficult time putting weight on the left leg.  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  She is having persistent knee swelling and feeling like the knee is giving out for which she is using an over-the-counter knee brace. ? ? ? ?Surgical History:   ?None ? ?PMH/PSH/Family History/Social History/Meds/Allergies:   ? ?Past Medical History:  ?Diagnosis Date  ? Allergy   ? seasonal allergies  ? Diabetes mellitus without complication (HCC)   ? STD (sexually transmitted disease) 2018  ? Tx'd for Chlamydia  ? ?Past Surgical History:  ?Procedure Laterality Date  ? COLON SURGERY    ? as an infant  ? ?Social History  ? ?Socioeconomic History  ? Marital status: Single  ?  Spouse name: Not on file  ? Number of children: Not on file  ? Years of education: Not on file  ? Highest education level: Not on file  ?Occupational History  ? Not on file  ?Tobacco Use  ? Smoking status: Never  ? Smokeless tobacco: Never  ?Vaping Use  ? Vaping Use: Never used  ?Substance and Sexual Activity  ? Alcohol use: Not Currently  ? Drug use: Never  ? Sexual activity: Yes  ?  Partners: Male  ?  Birth control/protection: Pill  ?Other  Topics Concern  ? Not on file  ?Social History Narrative  ? Not on file  ? ?Social Determinants of Health  ? ?Financial Resource Strain: Not on file  ?Food Insecurity: Not on file  ?Transportation Needs: Not on file  ?Physical Activity: Not on file  ?Stress: Not on file  ?Social Connections: Not on file  ? ?Family History  ?Problem Relation Age of Onset  ? Diabetes Mother   ?     Type I  ? Lupus Mother   ? Allergies Father   ? Hypertension Maternal Grandmother   ? Stroke Maternal Grandmother   ? Diabetes Maternal Grandfather   ? Glaucoma Paternal Grandfather   ? Ovarian cancer Maternal Aunt   ? ?No Known Allergies ?Current Outpatient Medications  ?Medication Sig Dispense Refill  ? famotidine (PEPCID) 40 MG tablet Take 40 mg by mouth 2 (two) times daily.    ? glucose blood (ACCU-CHEK AVIVA PLUS) test strip USE AS DIRECTED TO TEST BLOOD SUGAR 6-8 TIMES PER DAY. **PT NEEDS APPT** 300 each 0  ? insulin aspart (NOVOLOG FLEXPEN) 100 UNIT/ML FlexPen  INJECT SUBCUTANEOUSLY AS DIRECTED UP TO MAX DAILY DOSE OF 75 UNITS **PT NEEDS FOLLOW UP APPT WITH DR GHERGHE** 10 pen 0  ? Insulin Glargine (LANTUS SOLOSTAR) 100 UNIT/ML Solostar Pen Inject 28 Units into the skin daily at 10 pm. **PT NEEDS FOLLOW UP APPT WITH DR GHERGHE** 10 pen 0  ? norethindrone (MICRONOR) 0.35 MG tablet Take 1 tablet (0.35 mg total) by mouth daily. 84 tablet 3  ? ?No current facility-administered medications for this visit.  ? ?No results found. ? ?Review of Systems:   ?A ROS was performed including pertinent positives and negatives as documented in the HPI. ? ?Physical Exam :   ?Constitutional: NAD and appears stated age ?Neurological: Alert and oriented ?Psych: Appropriate affect and cooperative ?There were no vitals taken for this visit.  ? ?Comprehensive Musculoskeletal Exam:   ? ?  ?Musculoskeletal Exam  ?Gait Normal  ?Alignment Normal  ? Right Left  ?Inspection Normal Normal  ?Palpation    ?Tenderness None None  ?Crepitus None None  ?Effusion None  Moderate  ?Range of Motion    ?Extension 0 0  ?Flexion 135 120  ?Strength    ?Extension 5/5 5/5  ?Flexion 5/5 5/5  ?Ligament Exam     ?Generalized Laxity No No  ?Lachman Negative Grade 3A  ?Pivot Shift Negative Positive  ?Anterior Drawer Negative Positive  ?Valgus at 0 Negative Negative  ?Valgus at 20 Negative Negative  ?Varus at 0 0 0  ?Varus at 20   0 0  ?Posterior Drawer at 90 0 0  ?Vascular/Lymphatic Exam    ?Edema None None  ?Venous Stasis Changes No No  ?Distal Circulation Normal Normal  ?Neurologic    ?Light Touch Sensation Intact Intact  ?Special Tests:   ? ? ? ?Imaging:   ?Xray (4 views left knee): ?Left knee reverse Segond fracture involving the left knee ? ?MRI left knee: ?There is a complete tear of the anterior cruciate ligament with a bone contusion pattern as well as consistent with this.  There is a Segond type avulsion ? ? ?I personally reviewed and interpreted the radiographs. ? ? ?Assessment:   ?26 year old female with left knee anterior cruciate ligament injury after she fell and tripped on her heater earlier this month.  At this point I described the operative versus nonoperative options for her.  We did discuss the possibility of continued bracing and physical therapy with regard to this knee.  She does prefer to stay active and exercise.  She does not want to be limited by the knee in the future and we did discuss the possibility of future instability in the setting of an ACL deficient knee.  She would like to avoid this if at all possible.  We did discuss the alternative of ACL reconstruction.  We did discuss the potentially counterproductive as well as meniscal protective effects of having an ACL reconstruction.  She understands this in comparison to the nonoperative treatments.  At this time given the fact that she would like to stay active with her typical exercise routine, I do believe that ACL reconstruction is indicated.  We discussed graft options.  After further discussion she is  elected to proceed with a quadriceps tendon autograft.  I did discuss that while there is discussion of a meniscal tear on the radiology report, I do not necessarily see a tear.  I did discuss that meniscal repair would be the gold standard in the setting of an acute injury and that ultimately this would be intervened  upon at the time of arthroscopy if needed.  She understands that this could result in several weeks of nonweightbearing.  We will plan for ACL reconstruction ? ?Plan :   ? ?-Plan for left knee ACL reconstruction with quadriceps tendon autograft ?-I will plan to order medication on the day of surgery for postop ? ? ?After a lengthy discussion of treatment options, including risks, benefits, alternatives, complications of surgical and nonsurgical conservative options, the patient elected surgical repair.  ? ?The patient  is aware of the material risks  and complications including, but not limited to injury to adjacent structures, neurovascular injury, infection, numbness, bleeding, implant failure, thermal burns, stiffness, persistent pain, failure to heal, disease transmission from allograft, need for further surgery, dislocation, anesthetic risks, blood clots, risks of death,and others. The probabilities of surgical success and failure discussed with patient given their particular co-morbidities.The time and nature of expected rehabilitation and recovery was discussed.The patient's questions were all answered preoperatively.  No barriers to understanding were noted. ?I explained the natural history of the disease process and Rx rationale.  I explained to the patient what I considered to be reasonable expectations given their personal situation.  The final treatment plan was arrived at through a shared patient decision making process model. ? ? ?Patient was prescribed a hinged knee brace for the diagnosis listed above under assessment. The patient is ambulatory but has weakness and / or instability of  their left knee which requires stabilization from this semi-rigid / rigid orthosis to improve their function. ? ? ? ? ?I personally saw and evaluated the patient, and participated in the management and treatme

## 2021-11-14 ENCOUNTER — Other Ambulatory Visit: Payer: Self-pay | Admitting: Obstetrics and Gynecology

## 2021-11-14 DIAGNOSIS — Z3041 Encounter for surveillance of contraceptive pills: Secondary | ICD-10-CM

## 2021-11-27 ENCOUNTER — Encounter (HOSPITAL_BASED_OUTPATIENT_CLINIC_OR_DEPARTMENT_OTHER): Payer: Self-pay | Admitting: Orthopaedic Surgery

## 2021-11-28 ENCOUNTER — Encounter (HOSPITAL_BASED_OUTPATIENT_CLINIC_OR_DEPARTMENT_OTHER): Payer: Self-pay | Admitting: Orthopaedic Surgery

## 2021-12-03 ENCOUNTER — Telehealth: Payer: Self-pay | Admitting: Orthopaedic Surgery

## 2021-12-03 NOTE — Telephone Encounter (Signed)
Pt dropped off [employer] paperwork and $25 cash ?

## 2021-12-03 NOTE — Progress Notes (Signed)
Chart reviewed for anesthesia evaluation regarding upcoming ACL repair. Per Dr Smith Robert Ms Fetters  will need to follow up with PCP and be optimized from a medical standpoint prior to upcoming surgery. Last documented  BPs' 151/103 176/117 150/101 160/107. Pt is also a Type 1 diabetic. Attempts from patient outreach 11/21/21 ([place]) have not been successful in coordinating this care. ?LVM with April at Dr Eddie Dibbles office requesting this.  ?

## 2021-12-04 ENCOUNTER — Encounter (HOSPITAL_BASED_OUTPATIENT_CLINIC_OR_DEPARTMENT_OTHER): Payer: Self-pay

## 2021-12-04 ENCOUNTER — Telehealth: Payer: Self-pay | Admitting: Orthopaedic Surgery

## 2021-12-04 NOTE — Telephone Encounter (Signed)
Patient's left knee surgery had to be postponed due to clearance required from [facility]. Patient has uncontrolled Type 1 Diabetes, and extremely high BP that she has not followed up with her provider for in a year . Patient has an appointment with her PCP 01/01/22. Patient requests a call to discuss the possible outcome of her knee since she is unable to have surgery at this time. Please call to advise. ?

## 2021-12-04 NOTE — Telephone Encounter (Signed)
After verbal discussion with Dr. Steward Drone, I responded to the patient via MyChart and informed her as long as surgery is performed within 3 months, outcomes are still favorable ?

## 2021-12-11 ENCOUNTER — Ambulatory Visit (HOSPITAL_BASED_OUTPATIENT_CLINIC_OR_DEPARTMENT_OTHER): Admit: 2021-12-11 | Payer: Managed Care, Other (non HMO) | Admitting: Orthopaedic Surgery

## 2021-12-11 ENCOUNTER — Encounter (HOSPITAL_BASED_OUTPATIENT_CLINIC_OR_DEPARTMENT_OTHER): Payer: Self-pay

## 2021-12-11 SURGERY — KNEE ARTHROSCOPY WITH ANTERIOR CRUCIATE LIGAMENT (ACL) REPAIR WITH HAMSTRING GRAFT
Anesthesia: Regional | Laterality: Left

## 2021-12-14 ENCOUNTER — Ambulatory Visit (HOSPITAL_BASED_OUTPATIENT_CLINIC_OR_DEPARTMENT_OTHER): Payer: Self-pay | Admitting: Physical Therapy

## 2021-12-20 ENCOUNTER — Encounter (HOSPITAL_BASED_OUTPATIENT_CLINIC_OR_DEPARTMENT_OTHER): Payer: Self-pay | Admitting: Physical Therapy

## 2021-12-26 ENCOUNTER — Encounter (HOSPITAL_BASED_OUTPATIENT_CLINIC_OR_DEPARTMENT_OTHER): Payer: Managed Care, Other (non HMO) | Admitting: Orthopaedic Surgery

## 2021-12-26 ENCOUNTER — Encounter (HOSPITAL_BASED_OUTPATIENT_CLINIC_OR_DEPARTMENT_OTHER): Payer: Self-pay | Admitting: Physical Therapy

## 2022-01-02 ENCOUNTER — Encounter (HOSPITAL_BASED_OUTPATIENT_CLINIC_OR_DEPARTMENT_OTHER): Payer: Self-pay | Admitting: Physical Therapy

## 2022-01-04 NOTE — Progress Notes (Signed)
26 y.o. [place] or African American Not Hispanic or Latino female here for annual exam.  On POP's. Period Cycle (Days): 24 (Patient states that her cycle length used to be 28 days then for a year it was 21 days and now the last 2 months it has been 24 days.) Period Duration (Days): 5-6 Period Pattern: Regular Menstrual Flow: Moderate (The first two days she passes clotts) Menstrual Control: Tampon Menstrual Control Change Freq (Hours): 4 Dysmenorrhea: (!) Mild Dysmenorrhea Symptoms: Cramping, Nausea  She c/o a 2-3 week h/o an increase, thin, white vaginal d/c with an odor.   Sexually active, same partner x 2 months. Using condoms   H/O DM, HgbA1C was 8.6 on 12/03/21. Adjustments were made in her medication.   Started on medication for HTN.   She tore her ACL in 3/23, needs surgery but needs to have better control of her DM.   Patient's last menstrual period was 01/05/2022.          Sexually active: Yes.    The current method of family planning is oral progesterone-only contraceptive.    Exercising: No.  The patient does not participate in regular exercise at present. Smoker:  no  Health Maintenance: Pap:   09/13/19 Neg History of abnormal Pap:  no MMG:  none  BMD:   none  Colonoscopy: none  TDaP:  UTD Gardasil: complete    reports that she has never smoked. She has never used smokeless tobacco. She reports that she does not currently use alcohol. She reports that she does not use drugs.Occasional ETOH. Works in a lab.   Past Medical History:  Diagnosis Date   Allergy    seasonal allergies   Diabetes mellitus without complication ([place])    STD (sexually transmitted disease) 2018   Tx'd for Chlamydia    Past Surgical History:  Procedure Laterality Date   COLON SURGERY     as an infant    Current Outpatient Medications  Medication Sig Dispense Refill   famotidine (PEPCID) 40 MG tablet Take 40 mg by mouth 2 (two) times daily.     glucose blood (ACCU-CHEK  AVIVA PLUS) test strip USE AS DIRECTED TO TEST BLOOD SUGAR 6-8 TIMES PER DAY. **PT NEEDS APPT** 300 each 0   insulin aspart (NOVOLOG FLEXPEN) 100 UNIT/ML FlexPen INJECT SUBCUTANEOUSLY AS DIRECTED UP TO MAX DAILY DOSE OF 75 UNITS **PT NEEDS FOLLOW UP APPT WITH DR GHERGHE** 10 pen 0   Insulin Glargine (LANTUS SOLOSTAR) 100 UNIT/ML Solostar Pen Inject 28 Units into the skin daily at 10 pm. **PT NEEDS FOLLOW UP APPT WITH DR GHERGHE** 10 pen 0   losartan (COZAAR) 50 MG tablet Take 50 mg by mouth daily.     norethindrone (MICRONOR) 0.35 MG tablet Take 1 tablet (0.35 mg total) by mouth daily. 84 tablet 3   No current facility-administered medications for this visit.    Family History  Problem Relation Age of Onset   Diabetes Mother        Type I   Lupus Mother    Allergies Father    Hypertension Maternal Grandmother    Stroke Maternal Grandmother    Diabetes Maternal Grandfather    Glaucoma Paternal Grandfather    Ovarian cancer Maternal Aunt     Review of Systems  Genitourinary:  Positive for vaginal discharge.  All other systems reviewed and are negative.   Exam:   BP 136/82   Pulse 98   Ht 4' 11.75" (1.518 m)  Wt 154 lb (69.9 kg)   LMP 01/05/2022   SpO2 100%   BMI 30.33 kg/m   Weight change: @WEIGHTCHANGE @ Height:   Height: 4' 11.75" (151.8 cm)  Ht Readings from Last 3 Encounters:  01/14/22 4' 11.75" (1.518 m)  09/29/20 5' 0.25" (1.53 m)  01/19/20 4\' 11"  (1.499 m)    General appearance: alert, cooperative and appears stated age Head: Normocephalic, without obvious abnormality, atraumatic Neck: no adenopathy, supple, symmetrical, trachea midline and thyroid normal to inspection and palpation Lungs: clear to auscultation bilaterally Cardiovascular: regular rate and rhythm Breasts: normal appearance, no masses or tenderness Abdomen: soft, non-tender; non distended,  no masses,  no organomegaly Extremities: extremities normal, atraumatic, no cyanosis or edema Skin: Skin  color, texture, turgor normal. No rashes or lesions Lymph nodes: Cervical, supraclavicular, and axillary nodes normal. No abnormal inguinal nodes palpated Neurologic: Grossly normal   Pelvic: External genitalia:  no lesions              Urethra:  normal appearing urethra with no masses, tenderness or lesions              Bartholins and Skenes: normal                 Vagina: normal appearing vagina with normal color and discharge, no lesions              Cervix: no lesions               Bimanual Exam:  Uterus:  normal size, contour, position, consistency, mobility, non-tender and anteverted, retroflexed              Adnexa: no mass, fullness, tenderness               Rectovaginal: Confirms               Anus:  normal sphincter tone, no lesions  Gae Dry, CMA chaperoned for the exam.  1. Well woman exam Discussed breast self exam Discussed calcium and vit D intake Pap due next year  2. Screening examination for STD (sexually transmitted disease) - RPR - HSV 2 antibody, IgG - SURESWAB CT/NG/T. vaginalis  3. Encounter for surveillance of contraceptive pills Doing well - norethindrone (MICRONOR) 0.35 MG tablet; Take 1 tablet (0.35 mg total) by mouth daily.  Dispense: 84 tablet; Refill: 3  4. Acute vaginitis - WET PREP FOR TRICH, YEAST, CLUE  5. BV (bacterial vaginosis) - metroNIDAZOLE (METROGEL) 0.75 % vaginal gel; Place 1 Applicatorful vaginally at bedtime. Use nightly for 5 nights  Dispense: 70 g; Refill: 0  6. Screening for HIV (human immunodeficiency virus) - HIV Antibody (routine testing w rflx)  7. Encounter for screening for other viral diseases - Hepatitis C antibody

## 2022-01-14 ENCOUNTER — Ambulatory Visit (INDEPENDENT_AMBULATORY_CARE_PROVIDER_SITE_OTHER): Payer: Managed Care, Other (non HMO) | Admitting: Obstetrics and Gynecology

## 2022-01-14 ENCOUNTER — Encounter: Payer: Self-pay | Admitting: Obstetrics and Gynecology

## 2022-01-14 VITALS — BP 136/82 | HR 98 | Ht 59.75 in | Wt 154.0 lb

## 2022-01-14 DIAGNOSIS — Z1159 Encounter for screening for other viral diseases: Secondary | ICD-10-CM

## 2022-01-14 DIAGNOSIS — Z3041 Encounter for surveillance of contraceptive pills: Secondary | ICD-10-CM | POA: Diagnosis not present

## 2022-01-14 DIAGNOSIS — Z01419 Encounter for gynecological examination (general) (routine) without abnormal findings: Secondary | ICD-10-CM

## 2022-01-14 DIAGNOSIS — N760 Acute vaginitis: Secondary | ICD-10-CM | POA: Diagnosis not present

## 2022-01-14 DIAGNOSIS — Z113 Encounter for screening for infections with a predominantly sexual mode of transmission: Secondary | ICD-10-CM

## 2022-01-14 DIAGNOSIS — B9689 Other specified bacterial agents as the cause of diseases classified elsewhere: Secondary | ICD-10-CM

## 2022-01-14 DIAGNOSIS — Z114 Encounter for screening for human immunodeficiency virus [HIV]: Secondary | ICD-10-CM

## 2022-01-14 LAB — WET PREP FOR TRICH, YEAST, CLUE

## 2022-01-14 MED ORDER — NORETHINDRONE 0.35 MG PO TABS: 1.0000 | ORAL_TABLET | Freq: Every day | ORAL | 3 refills | Status: DC

## 2022-01-14 MED ORDER — METRONIDAZOLE 0.75 % VA GEL: 1.0000 | Freq: Every day | VAGINAL | 0 refills | Status: DC

## 2022-01-15 LAB — SURESWAB CT/NG/T. VAGINALIS
C. trachomatis RNA, TMA: NOT DETECTED
N. gonorrhoeae RNA, TMA: NOT DETECTED
Trichomonas vaginalis RNA: NOT DETECTED

## 2022-01-18 LAB — RPR: RPR Ser Ql: NONREACTIVE

## 2022-01-18 LAB — HSV 2 ANTIBODY, IGG: HSV 2 Glycoprotein G Ab, IgG: <0.9 index

## 2022-01-18 LAB — HIV ANTIBODY (ROUTINE TESTING W REFLEX): HIV 1&2 Ab, 4th Generation: NONREACTIVE

## 2022-01-18 LAB — HEPATITIS C ANTIBODY
Hepatitis C Ab: NONREACTIVE
SIGNAL TO CUT-OFF: 0.16 (ref <1.00)

## 2022-01-18 LAB — HSV 1 ANTIBODY, IGG: HSV 1 Glycoprotein G Ab, IgG: <0.9 index

## 2022-05-14 ENCOUNTER — Ambulatory Visit: Payer: Managed Care, Other (non HMO) | Admitting: Nurse Practitioner

## 2022-05-14 ENCOUNTER — Encounter: Payer: Self-pay | Admitting: Nurse Practitioner

## 2022-05-14 VITALS — BP 142/90 | HR 115

## 2022-05-14 DIAGNOSIS — N920 Excessive and frequent menstruation with regular cycle: Secondary | ICD-10-CM | POA: Diagnosis not present

## 2022-05-14 DIAGNOSIS — N946 Dysmenorrhea, unspecified: Secondary | ICD-10-CM | POA: Diagnosis not present

## 2022-05-14 DIAGNOSIS — Z113 Encounter for screening for infections with a predominantly sexual mode of transmission: Secondary | ICD-10-CM | POA: Diagnosis not present

## 2022-05-14 NOTE — Progress Notes (Signed)
   Acute Office Visit  Subjective:    Patient ID: Brittany Dixon, female    DOB: [birth year], 26 y.o.   MRN: [id number]   HPI 26 y.o. presents today for heavy menses. Changes started about a year ago and have progressively worsened. Cycles are occurring every 21-24 days, on heaviest days (first 3 days) she changes pad/tampons every 2 hours and has palm-sized clots, total bleeding time of 7 days. Also having more sharp pains, mainly on left side. On Norethindrone. H/O HTN, T1DM. She would like STD screening today. Same partner, negative screening in June.    Review of Systems  Constitutional: Negative.   Genitourinary:  Positive for menstrual problem.       Objective:    Physical Exam Constitutional:      Appearance: Normal appearance.  Genitourinary:    General: Normal vulva.     Vagina: Normal.     Cervix: Normal.     Uterus: Normal.      BP (!) 142/90   Pulse (!) 115   LMP 04/26/2022 (Approximate)   SpO2 99%  Wt Readings from Last 3 Encounters:  01/14/22 154 lb (69.9 kg)  09/29/20 153 lb (69.4 kg)  01/19/20 144 lb (65.3 kg)        Patient informed chaperone available to be present for breast and/or pelvic exam. Patient has requested no chaperone to be present. Patient has been advised what will be completed during breast and pelvic exam.   Assessment & Plan:   Problem List Items Addressed This Visit   None Visit Diagnoses     Menorrhagia with regular cycle    -  Primary   Relevant Orders   US PELVIS TRANSVAGINAL NON-OB (TV ONLY)   Dysmenorrhea       Relevant Orders   US PELVIS TRANSVAGINAL NON-OB (TV ONLY)   Screening examination for STD (sexually transmitted disease)       Relevant Orders   SURESWAB CT/NG/T. vaginalis      Plan: Will schedule ultrasound for abnormal bleeding while on POPs. We discussed other progestin-only options for bleeding control to include Slynd, Nexplanon, Depo, and IUD. She is leaning towards Slynd. STD panel pending.        Tamela Gammon DNP, 11:02 AM 05/14/2022

## 2022-05-15 LAB — SURESWAB CT/NG/T. VAGINALIS
C. trachomatis RNA, TMA: DETECTED — AB
N. gonorrhoeae RNA, TMA: NOT DETECTED
Trichomonas vaginalis RNA: NOT DETECTED

## 2022-05-16 ENCOUNTER — Other Ambulatory Visit: Payer: Self-pay | Admitting: Nurse Practitioner

## 2022-05-16 DIAGNOSIS — A749 Chlamydial infection, unspecified: Secondary | ICD-10-CM

## 2022-05-16 MED ORDER — DOXYCYCLINE MONOHYDRATE 100 MG PO CAPS: 100.0000 mg | ORAL_CAPSULE | Freq: Two times a day (BID) | ORAL | 0 refills | Status: AC

## 2022-05-17 ENCOUNTER — Telehealth: Payer: Self-pay | Admitting: *Deleted

## 2022-05-17 NOTE — Telephone Encounter (Signed)
Communicable Disease Report Completed and faxed to [facility].

## 2022-05-17 NOTE — Telephone Encounter (Signed)
-----   Message from Tamela Gammon, NP sent at 05/16/2022  7:37 AM EDT ----- Please let her know she came back positive for Chlamydia. Doxy BID x 7 days has been sent to pharmacy, offer EPT, no intercourse x 7 days, and TOC in 4 weeks.

## 2022-05-17 NOTE — Telephone Encounter (Signed)
Burnice Logan, RN  05/17/2022  2:44 PM EDT Back to Top    Spoke with patient. Advised of results and recommendations per Jonelle Sidle, NP.  Patient picked up Rx on 10/12 and started 10/13.  EPT reviewed, partner information provided, Rx to be called into [facility].  TOC scheduled for 11/7 at 0900.  See telephone encounter dated 10/13 for additional concern to review with Jonelle Sidle, NP.  Patient verbalizes understanding and is agreeable.    Call placed to [facility], spoke with [facility].  VO given for Doxcycline 100 mg Tab PO BID x14 days. #14/0RF for EPT Read back and confirmed   Patient is scheduled for PUS on 10/31.  She is asking if appropriate to cancel PUS at this time given diagnosis of Chlamydia? See if symptoms resolve after treatment completed?   Advised I will forward to Tiffany, NP to review and f/u next week. Patient agreeable.   Routing to [place], NP

## 2022-05-20 NOTE — Telephone Encounter (Signed)
Call returned to patient.  Left detailed message, ok per dpr.  Advised per Jonelle Sidle, NP.  Advised to return call to office if any additional questions.   Encounter closed.

## 2022-05-20 NOTE — Telephone Encounter (Signed)
Her cycles began changing about a year ago per patient, so likely not related to infection. I would recommend keeping ultrasound appointment.

## 2022-06-04 ENCOUNTER — Ambulatory Visit (INDEPENDENT_AMBULATORY_CARE_PROVIDER_SITE_OTHER): Payer: Managed Care, Other (non HMO) | Admitting: Nurse Practitioner

## 2022-06-04 ENCOUNTER — Encounter: Payer: Self-pay | Admitting: Nurse Practitioner

## 2022-06-04 ENCOUNTER — Ambulatory Visit (INDEPENDENT_AMBULATORY_CARE_PROVIDER_SITE_OTHER): Payer: Managed Care, Other (non HMO)

## 2022-06-04 VITALS — BP 132/82 | HR 118

## 2022-06-04 DIAGNOSIS — N946 Dysmenorrhea, unspecified: Secondary | ICD-10-CM

## 2022-06-04 DIAGNOSIS — N920 Excessive and frequent menstruation with regular cycle: Secondary | ICD-10-CM

## 2022-06-04 DIAGNOSIS — R188 Other ascites: Secondary | ICD-10-CM | POA: Diagnosis not present

## 2022-06-04 MED ORDER — SLYND 4 MG PO TABS: 1.0000 | ORAL_TABLET | Freq: Every day | ORAL | 1 refills | Status: DC

## 2022-06-04 NOTE — Progress Notes (Addendum)
   Acute Office Visit  Subjective:    Patient ID: Brittany Dixon, female    DOB: 1995-11-17, 26 y.o.   MRN: [id number]   HPI 26 y.o. presents today for ultrasound. Seen 05/14/22 with complaints of heavy menses. Changes started about a year ago and have progressively worsened. Cycles are occurring every 21-24 days, on heaviest days (first 3 days) she changes pad/tampons every 2 hours and has palm-sized clots, total bleeding time of 7 days. On Norethindrone. Denies pain. + Chlamydia 05/14/22.    Review of Systems  Constitutional: Negative.   Genitourinary:  Positive for menstrual problem. Negative for pelvic pain.       Objective:    Physical Exam Constitutional:      Appearance: Normal appearance.   GU: Not indicated  BP 132/82   Pulse (!) 118   LMP 05/25/2022 (Approximate)   SpO2 94%  Wt Readings from Last 3 Encounters:  01/14/22 154 lb (69.9 kg)  09/29/20 153 lb (69.4 kg)  01/19/20 144 lb (65.3 kg)       Assessment & Plan:   Problem List Items Addressed This Visit   None Visit Diagnoses     Menorrhagia with regular cycle    -  Primary   Relevant Medications   Drospirenone (SLYND) 4 MG TABS   Other Relevant Orders   CBC with Differential/Platelet   TSH   Free fluid in pelvis       Relevant Orders   CBC with Differential/Platelet   Comprehensive metabolic panel      Vaginal ultrasound: Anteverted uterus, normal size and shape, no myometrial masses.  Symmetrical endometrium measuring 11.2 mm.  No obvious masses or thickening seen.  Both ovaries normal size with normal-appearing follicle pattern.  No adnexal masses seen abdominally or vaginally.  Large amount of free fluid in mid to lower abdomen.  Plan: Discussed ultrasound findings with patient. Recommend CT of abdomen/pelvis for large amount of free fluid seen in lower abdomen. TSH, CMP and CBC today. Discussed other progestin-only methods last visit. Wants to stick to POPS. Will switch to [facility]. Aware of  proper use. Returns next week for TOC.      [place], 4:11 PM 06/04/2022

## 2022-06-04 NOTE — Addendum Note (Signed)
Addended byMarny Lowenstein on: 06/04/2022 04:13 PM   Modules accepted: Orders

## 2022-06-05 LAB — COMPREHENSIVE METABOLIC PANEL
AG Ratio: 1.4 (calc) (ref 1.0–2.5)
ALT: 17 U/L (ref 6–29)
AST: 14 U/L (ref 10–30)
Albumin: 4.1 g/dL (ref 3.6–5.1)
Alkaline phosphatase (APISO): 81 U/L (ref 31–125)
BUN: 12 mg/dL (ref 7–25)
CO2: 23 mmol/L (ref 20–32)
Calcium: 9.1 mg/dL (ref 8.6–10.2)
Chloride: 103 mmol/L (ref 98–110)
Creat: 0.86 mg/dL (ref 0.50–0.96)
Globulin: 3 g/dL (calc) (ref 1.9–3.7)
Glucose, Bld: 367 mg/dL — ABNORMAL HIGH (ref 65–99)
Potassium: 4.2 mmol/L (ref 3.5–5.3)
Sodium: 137 mmol/L (ref 135–146)
Total Bilirubin: 0.4 mg/dL (ref 0.2–1.2)
Total Protein: 7.1 g/dL (ref 6.1–8.1)

## 2022-06-05 LAB — CBC WITH DIFFERENTIAL/PLATELET
Absolute Monocytes: 435 cells/uL (ref 200–950)
Basophils Absolute: 22 cells/uL (ref 0–200)
Basophils Relative: 0.4 %
Eosinophils Absolute: 50 cells/uL (ref 15–500)
Eosinophils Relative: 0.9 %
HCT: 40.3 % (ref 35.0–45.0)
Hemoglobin: 12.2 g/dL (ref 11.7–15.5)
Lymphs Abs: 1733 cells/uL (ref 850–3900)
MCH: 24.6 pg — ABNORMAL LOW (ref 27.0–33.0)
MCHC: 30.3 g/dL — ABNORMAL LOW (ref 32.0–36.0)
MCV: 81.4 fL (ref 80.0–100.0)
MPV: 9.3 fL (ref 7.5–12.5)
Monocytes Relative: 7.9 %
Neutro Abs: 3262 cells/uL (ref 1500–7800)
Neutrophils Relative %: 59.3 %
Platelets: 486 10*3/uL — ABNORMAL HIGH (ref 140–400)
RBC: 4.95 10*6/uL (ref 3.80–5.10)
RDW: 14.1 % (ref 11.0–15.0)
Total Lymphocyte: 31.5 %
WBC: 5.5 10*3/uL (ref 3.8–10.8)

## 2022-06-05 LAB — TSH: TSH: 1.11 mIU/L

## 2022-06-06 ENCOUNTER — Telehealth: Payer: Self-pay | Admitting: *Deleted

## 2022-06-06 DIAGNOSIS — R188 Other ascites: Secondary | ICD-10-CM

## 2022-06-06 NOTE — Telephone Encounter (Signed)
Order placed, mychart message sent

## 2022-06-06 NOTE — Telephone Encounter (Signed)
-----   Message from Tamela Gammon, NP sent at 06/06/2022 10:33 AM EDT ----- Regarding: RE: CT abd/pelvis Without ----- Message ----- From: Thamas Jaegers, RMA Sent: 06/06/2022  10:31 AM EDT To: Tamela Gammon, NP Subject: RE: CT abd/pelvis                              Hi Tiffany  Is this with contrast, w/wo contrasts or no contrast? ----- Message ----- From: Tamela Gammon, NP Sent: 06/04/2022   4:10 PM EDT To: [facility] Triage Subject: CT abd/pelvis                                  Please order CT scan of abdomen and pelvis for free fluid in pelvis.

## 2022-06-07 NOTE — Telephone Encounter (Signed)
[facility] left message for patient to call. 

## 2022-06-11 ENCOUNTER — Encounter: Payer: Self-pay | Admitting: Nurse Practitioner

## 2022-06-11 ENCOUNTER — Ambulatory Visit: Payer: Managed Care, Other (non HMO) | Admitting: Nurse Practitioner

## 2022-06-11 VITALS — BP 122/82 | HR 108

## 2022-06-11 DIAGNOSIS — L292 Pruritus vulvae: Secondary | ICD-10-CM | POA: Diagnosis not present

## 2022-06-11 DIAGNOSIS — A749 Chlamydial infection, unspecified: Secondary | ICD-10-CM

## 2022-06-11 LAB — WET PREP FOR TRICH, YEAST, CLUE

## 2022-06-11 MED ORDER — FLUCONAZOLE 150 MG PO TABS: 150.0000 mg | ORAL_TABLET | ORAL | 0 refills | Status: DC

## 2022-06-11 NOTE — Progress Notes (Signed)
   Acute Office Visit  Subjective:    Patient ID: Brittany Dixon, female    DOB: 05-06-1996, 26 y.o.   MRN: [id number]   HPI 26 y.o. presents today for TOC. + Chlamydia 05/14/2022. Finished full course of Doxycycline, thinks partner was treated, has had protected intercourse since. Reports vulvar itching today without discharge or odor.    Review of Systems  Constitutional: Negative.   Genitourinary:  Positive for vaginal pain (Vulvar itching). Negative for vaginal discharge.       Objective:    Physical Exam Constitutional:      Appearance: Normal appearance.  Genitourinary:    Vagina: Normal.     Cervix: Normal.       BP 122/82   Pulse (!) 108   LMP 05/25/2022 (Approximate)   SpO2 99%  Wt Readings from Last 3 Encounters:  01/14/22 154 lb (69.9 kg)  09/29/20 153 lb (69.4 kg)  01/19/20 144 lb (65.3 kg)        Patient informed chaperone available to be present for breast and/or pelvic exam. Patient has requested no chaperone to be present. Patient has been advised what will be completed during breast and pelvic exam.   Wet prep + yeast  Assessment & Plan:   Problem List Items Addressed This Visit   None Visit Diagnoses     Chlamydia infection    -  Primary   Relevant Medications   fluconazole (DIFLUCAN) 150 MG tablet   Other Relevant Orders   C. trachomatis/N. gonorrhoeae RNA   Vulvar itching       Relevant Medications   fluconazole (DIFLUCAN) 150 MG tablet   Other Relevant Orders   WET PREP FOR TRICH, YEAST, CLUE      Plan: Wet prep positive for yeast - Diflucan 150 mg today and repeat in 3 days for total of 2 doses. GC/CT pending.      Tamela Gammon DNP, 9:22 AM 06/11/2022

## 2022-06-12 LAB — C. TRACHOMATIS/N. GONORRHOEAE RNA
C. trachomatis RNA, TMA: NOT DETECTED
N. gonorrhoeae RNA, TMA: NOT DETECTED

## 2022-06-20 NOTE — Telephone Encounter (Signed)
Another message left by [facility]

## 2022-07-17 NOTE — Telephone Encounter (Signed)
Spoke with patient about the below. Apparently she was waiting to find out the cost. I explained she will need to schedule the CT first and we can get prior approval with insurance and then [facility] can discuss cost with her. Patient said will schedule and let us know via my chart.

## 2022-07-25 NOTE — Telephone Encounter (Signed)
Routing to [facility] Triage.  

## 2022-08-08 ENCOUNTER — Encounter (HOSPITAL_BASED_OUTPATIENT_CLINIC_OR_DEPARTMENT_OTHER): Payer: Self-pay | Admitting: Orthopaedic Surgery

## 2022-08-08 NOTE — Telephone Encounter (Signed)
A large amount of free fluid was seen in the pelvis. It is not abnormal to see a small amount, but the amount that was seen needs to be investigated. The CT scan will look at bones, blood vessels, organs and soft tissue in the pelvis and can identify infection, masses or tumors if present.

## 2022-08-21 ENCOUNTER — Ambulatory Visit (HOSPITAL_BASED_OUTPATIENT_CLINIC_OR_DEPARTMENT_OTHER): Payer: Managed Care, Other (non HMO) | Admitting: Orthopaedic Surgery

## 2022-08-28 ENCOUNTER — Ambulatory Visit (HOSPITAL_BASED_OUTPATIENT_CLINIC_OR_DEPARTMENT_OTHER): Payer: Managed Care, Other (non HMO) | Admitting: Orthopaedic Surgery

## 2022-08-28 ENCOUNTER — Other Ambulatory Visit (HOSPITAL_BASED_OUTPATIENT_CLINIC_OR_DEPARTMENT_OTHER): Payer: Self-pay

## 2022-08-28 DIAGNOSIS — S83512A Sprain of anterior cruciate ligament of left knee, initial encounter: Secondary | ICD-10-CM | POA: Diagnosis not present

## 2022-08-28 MED ORDER — OXYCODONE HCL 5 MG PO TABS
5.0000 mg | ORAL_TABLET | ORAL | 0 refills | Status: DC | PRN
  Filled 2022-08-28: qty 20, 4d supply, fill #0

## 2022-08-28 MED ORDER — ACETAMINOPHEN 500 MG PO TABS
500.0000 mg | ORAL_TABLET | Freq: Three times a day (TID) | ORAL | 0 refills | Status: AC
  Filled 2022-08-28: qty 30, 10d supply, fill #0

## 2022-08-28 MED ORDER — IBUPROFEN 800 MG PO TABS
800.0000 mg | ORAL_TABLET | Freq: Three times a day (TID) | ORAL | 0 refills | Status: AC
  Filled 2022-08-28: qty 30, 10d supply, fill #0

## 2022-08-28 MED ORDER — ASPIRIN 325 MG PO TBEC
325.0000 mg | DELAYED_RELEASE_TABLET | Freq: Every day | ORAL | 0 refills | Status: DC
  Filled 2022-08-28: qty 30, 30d supply, fill #0

## 2022-08-28 NOTE — Progress Notes (Signed)
Chief Complaint: Left knee pain     History of Present Illness:   08/28/2022: Presents today for follow-up of her left knee.  Overall she is still having residual instability.  She was not ultimately able to get clearance for her last surgery as she was having issues with elevated blood pressure  Brittany Dixon is a 27 y.o. female presents with left knee pain after she tripped over a heater on October 08, 2021.  She initially presented to the [facility] urgent care and was found to have a Segond fracture on x-ray.  She was sent here for further management.  She does have swelling in the left knee.  She states that she has not walked with crutches as she is having a difficult time putting weight on the left leg.  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  She is having persistent knee swelling and feeling like the knee is giving out for which she is using an over-the-counter knee brace.    Surgical History:   None  PMH/PSH/Family History/Social History/Meds/Allergies:    Past Medical History:  Diagnosis Date   Allergy    seasonal allergies   Diabetes mellitus without complication ([facility])    Hypertension    STD (sexually transmitted disease) 2018   Tx'd for Chlamydia   Past Surgical History:  Procedure Laterality Date   COLON SURGERY     as an infant   Social History   Socioeconomic History   Marital status: Single    Spouse name: Not on file   Number of children: Not on file   Years of education: Not on file   Highest education level: Not on file  Occupational History   Not on file  Tobacco Use   Smoking status: Never   Smokeless tobacco: Never  Vaping Use   Vaping Use: Never used  Substance and Sexual Activity   Alcohol use: Not Currently   Drug use: Never   Sexual activity: Yes    Partners: Male    Birth control/protection: Pill  Other Topics Concern   Not on file  Social History Narrative   Not on file   Social  Determinants of Health   Financial Resource Strain: Not on file  Food Insecurity: Not on file  Transportation Needs: Not on file  Physical Activity: Not on file  Stress: Not on file  Social Connections: Not on file   Family History  Problem Relation Age of Onset   Diabetes Mother        Type I   Lupus Mother    Allergies Father    Hypertension Maternal Grandmother    Stroke Maternal Grandmother    Diabetes Maternal Grandfather    Glaucoma Paternal Grandfather    Ovarian cancer Maternal Aunt    No Known Allergies Current Outpatient Medications  Medication Sig Dispense Refill   acetaminophen (TYLENOL) 500 MG tablet Take 1 tablet (500 mg total) by mouth every 8 (eight) hours for 10 days. 30 tablet 0   aspirin EC 325 MG tablet Take 1 tablet (325 mg total) by mouth daily. 30 tablet 0   ibuprofen (ADVIL) 800 MG tablet Take 1 tablet (800 mg total) by mouth every 8 (eight) hours for 10 days. Please take with food, please alternate with acetaminophen 30  tablet 0   oxycodone (OXY-IR) 5 MG capsule Take 1 capsule (5 mg total) by mouth every 4 (four) hours as needed (severe pain). 20 capsule 0   Drospirenone (SLYND) 4 MG TABS Take 1 tablet by mouth daily. 84 tablet 1   famotidine (PEPCID) 40 MG tablet Take 40 mg by mouth 2 (two) times daily.     fluconazole (DIFLUCAN) 150 MG tablet Take 1 tablet (150 mg total) by mouth every 3 (three) days. 2 tablet 0   insulin aspart (NOVOLOG FLEXPEN) 100 UNIT/ML FlexPen INJECT SUBCUTANEOUSLY AS DIRECTED UP TO MAX DAILY DOSE OF 75 UNITS **PT NEEDS FOLLOW UP APPT WITH DR GHERGHE** 10 pen 0   Insulin Glargine (LANTUS SOLOSTAR) 100 UNIT/ML Solostar Pen Inject 28 Units into the skin daily at 10 pm. **PT NEEDS FOLLOW UP APPT WITH DR GHERGHE** 10 pen 0   losartan (COZAAR) 100 MG tablet Take 100 mg by mouth daily.     No current facility-administered medications for this visit.   No results found.  Review of Systems:   A ROS was performed including pertinent  positives and negatives as documented in the HPI.  Physical Exam :   Constitutional: NAD and appears stated age Neurological: Alert and oriented Psych: Appropriate affect and cooperative There were no vitals taken for this visit.   Comprehensive Musculoskeletal Exam:      Musculoskeletal Exam  Gait Normal  Alignment Normal   Right Left  Inspection Normal Normal  Palpation    Tenderness None None  Crepitus None None  Effusion None Moderate  Range of Motion    Extension 0 0  Flexion 135 120  Strength    Extension 5/5 5/5  Flexion 5/5 5/5  Ligament Exam     Generalized Laxity No No  Lachman Negative Grade 3A  Pivot Shift Negative Positive  Anterior Drawer Negative Positive  Valgus at 0 Negative Negative  Valgus at 20 Negative Negative  Varus at 0 0 0  Varus at 20   0 0  Posterior Drawer at 90 0 0  Vascular/Lymphatic Exam    Edema None None  Venous Stasis Changes No No  Distal Circulation Normal Normal  Neurologic    Light Touch Sensation Intact Intact  Special Tests:      Imaging:   Xray (4 views left knee): Left knee reverse Segond fracture involving the left knee  MRI left knee: There is a complete tear of the anterior cruciate ligament with a bone contusion pattern as well as consistent with this.  There is a Segond type avulsion   I personally reviewed and interpreted the radiographs.   Assessment:   27 year old female with left knee anterior cruciate ligament injury after she fell and tripped on her heater earlier this month.  At this point I described the operative versus nonoperative options for her.  We did discuss the possibility of continued bracing and physical therapy with regard to this knee.  She does prefer to stay active and exercise.  She does not want to be limited by the knee in the future and we did discuss the possibility of future instability in the setting of an ACL deficient knee.  She would like to avoid this if at all possible.  We did  discuss the alternative of ACL reconstruction.  We did discuss the potentially counterproductive as well as meniscal protective effects of having an ACL reconstruction.  She understands this in comparison to the nonoperative treatments.  At this time given the fact  that she would like to stay active with her typical exercise routine, I do believe that ACL reconstruction is indicated.  We discussed graft options.  After further discussion she is elected to proceed with a quadriceps tendon autograft.  I did discuss that while there is discussion of a meniscal tear on the radiology report, I do not necessarily see a tear.  I did discuss that meniscal repair would be the gold standard in the setting of an acute injury and that ultimately this would be intervened upon at the time of arthroscopy if needed.  She understands that this could result in several weeks of nonweightbearing.  We will plan for ACL reconstruction  Plan :    -Plan for left knee ACL reconstruction with quadriceps tendon autograft   After a lengthy discussion of treatment options, including risks, benefits, alternatives, complications of surgical and nonsurgical conservative options, the patient elected surgical repair.   The patient  is aware of the material risks  and complications including, but not limited to injury to adjacent structures, neurovascular injury, infection, numbness, bleeding, implant failure, thermal burns, stiffness, persistent pain, failure to heal, disease transmission from allograft, need for further surgery, dislocation, anesthetic risks, blood clots, risks of death,and others. The probabilities of surgical success and failure discussed with patient given their particular co-morbidities.The time and nature of expected rehabilitation and recovery was discussed.The patient's questions were all answered preoperatively.  No barriers to understanding were noted. I explained the natural history of the disease process and Rx  rationale.  I explained to the patient what I considered to be reasonable expectations given their personal situation.  The final treatment plan was arrived at through a shared patient decision making process model.   Patient was prescribed a hinged knee brace for the diagnosis listed above under assessment. The patient is ambulatory but has weakness and / or instability of their left knee which requires stabilization from this semi-rigid / rigid orthosis to improve their function.     I personally saw and evaluated the patient, and participated in the management and treatment plan.  Vanetta Mulders, MD Attending Physician, Orthopedic Surgery  This document was dictated using Dragon voice recognition software. A reasonable attempt at proof reading has been made to minimize errors.

## 2022-09-05 ENCOUNTER — Other Ambulatory Visit (HOSPITAL_BASED_OUTPATIENT_CLINIC_OR_DEPARTMENT_OTHER): Payer: Self-pay

## 2022-09-24 ENCOUNTER — Ambulatory Visit
Admission: RE | Admit: 2022-09-24 | Discharge: 2022-09-24 | Disposition: A | Discharge status: Home / Self Care | Payer: Managed Care, Other (non HMO) | Source: Ambulatory Visit | Attending: Nurse Practitioner

## 2022-09-24 DIAGNOSIS — R188 Other ascites: Secondary | ICD-10-CM

## 2022-09-25 ENCOUNTER — Telehealth: Payer: Self-pay

## 2022-09-25 ENCOUNTER — Other Ambulatory Visit: Payer: Self-pay | Admitting: Nurse Practitioner

## 2022-09-25 DIAGNOSIS — R1900 Intra-abdominal and pelvic swelling, mass and lump, unspecified site: Secondary | ICD-10-CM

## 2022-09-25 NOTE — Telephone Encounter (Signed)
Tamela Gammon, NP  P [facility] Triage Needs abdominal/pelvic MRI with and without contrast. Also recommend coming in for labs. Please let patient know.

## 2022-09-25 NOTE — Telephone Encounter (Signed)
MRI orders have been placed.  TW has placed lab orders. Need to schedule lab appt for patient.  Left message for patient to call.

## 2022-09-30 NOTE — Telephone Encounter (Signed)
MRI abd and pelvis with and without contrast scheduled for 10/21/22 at [place].  Lab appt tomorrow morning. Orders are in.  Will route to [facility]. To inquire regarding PA for MRI.

## 2022-10-01 ENCOUNTER — Other Ambulatory Visit: Payer: Managed Care, Other (non HMO)

## 2022-10-01 DIAGNOSIS — R1900 Intra-abdominal and pelvic swelling, mass and lump, unspecified site: Secondary | ICD-10-CM

## 2022-10-02 LAB — CA 125: CA 125: 24 U/mL (ref <35)

## 2022-10-02 LAB — CEA: CEA: <2 ng/mL

## 2022-10-21 ENCOUNTER — Telehealth: Payer: Self-pay

## 2022-10-21 ENCOUNTER — Ambulatory Visit: Admission: RE | Admit: 2022-10-21 | Payer: Managed Care, Other (non HMO) | Source: Ambulatory Visit

## 2022-10-21 DIAGNOSIS — R1900 Intra-abdominal and pelvic swelling, mass and lump, unspecified site: Secondary | ICD-10-CM

## 2022-10-21 NOTE — Telephone Encounter (Signed)
[facility] called. Patient is scheduled at 11:40am for MRI abd and pelvis. They just noticed MRI abd was place "without contrast" and it should have been ordered "with an without contrast".  I corrected the order but they also say that PA must be corrected for Abd MRI with and without as well.  I will route to [place] [facility] priority and message her.  As of 11:39am patient had not yet arrive for her 11:40 appointment.

## 2022-10-21 NOTE — Telephone Encounter (Signed)
Brittany Dixon received authorization for MRI Abd with and without contrast. Kayla at [facility] was informed.  I will hold this encounter to make sure they reschedule her.

## 2022-10-28 NOTE — Telephone Encounter (Signed)
MRI abd and pelvis has been rescheduled to 11/19/22 at [place].

## 2022-10-29 ENCOUNTER — Ambulatory Visit: Payer: Managed Care, Other (non HMO) | Admitting: Obstetrics and Gynecology

## 2022-10-29 ENCOUNTER — Encounter: Payer: Self-pay | Admitting: Obstetrics and Gynecology

## 2022-10-29 VITALS — BP 128/74 | HR 62 | Wt 151.0 lb

## 2022-10-29 DIAGNOSIS — Z113 Encounter for screening for infections with a predominantly sexual mode of transmission: Secondary | ICD-10-CM

## 2022-10-29 DIAGNOSIS — B3731 Acute candidiasis of vulva and vagina: Secondary | ICD-10-CM | POA: Diagnosis not present

## 2022-10-29 DIAGNOSIS — N760 Acute vaginitis: Secondary | ICD-10-CM

## 2022-10-29 LAB — WET PREP FOR TRICH, YEAST, CLUE

## 2022-10-29 MED ORDER — FLUCONAZOLE 150 MG PO TABS: 150.0000 mg | ORAL_TABLET | Freq: Once | ORAL | 0 refills | Status: AC

## 2022-10-29 MED ORDER — METRONIDAZOLE 500 MG PO TABS: 500.0000 mg | ORAL_TABLET | Freq: Two times a day (BID) | ORAL | 0 refills | Status: DC

## 2022-10-29 MED ORDER — BETAMETHASONE VALERATE 0.1 % EX OINT: 1.0000 | TOPICAL_OINTMENT | Freq: Two times a day (BID) | CUTANEOUS | 0 refills | Status: DC

## 2022-10-29 NOTE — Progress Notes (Signed)
GYNECOLOGY  VISIT   HPI: 27 y.o.   Single Black or African American Not Hispanic or Latino  female   [id number] with Patient's last menstrual period was 10/12/2022.   here for STD testing. She is having some vaginal itching and discharge that smells acidic to her.    GYNECOLOGIC HISTORY: Patient's last menstrual period was 10/12/2022. 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   Palpable mass of neck 11/20/2020   Elevated BP without diagnosis of hypertension 10/31/2020   Primary hypertension 10/31/2020   Slow transit constipation 10/31/2020   Type 1 diabetes mellitus not at goal [facility]) 10/05/2013   Family history of other cardiovascular diseases(V17.49) 11/13/2009   Uncontrolled type 1 diabetes mellitus 06/01/2003    Past Medical History:  Diagnosis Date   Allergy    seasonal allergies   Diabetes mellitus without complication ([place])    Hypertension    STD (sexually transmitted disease) 2018   Tx'd for Chlamydia    Past Surgical History:  Procedure Laterality Date   COLON SURGERY     as an infant    Current Outpatient Medications  Medication Sig Dispense Refill   Drospirenone (SLYND) 4 MG TABS Take 1 tablet by mouth daily. 84 tablet 1   famotidine (PEPCID) 40 MG tablet Take 40 mg by mouth 2 (two) times daily.     insulin aspart (NOVOLOG FLEXPEN) 100 UNIT/ML FlexPen INJECT SUBCUTANEOUSLY AS DIRECTED UP TO MAX DAILY DOSE OF 75 UNITS **PT NEEDS FOLLOW UP APPT WITH DR GHERGHE** 10 pen 0   Insulin Glargine (LANTUS SOLOSTAR) 100 UNIT/ML Solostar Pen Inject 28 Units into the skin daily at 10 pm. **PT NEEDS FOLLOW UP APPT WITH DR GHERGHE** 10 pen 0   losartan (COZAAR) 100 MG  tablet Take 100 mg by mouth daily.     No current facility-administered medications for this visit.     ALLERGIES: Patient has no known allergies.  Family History  Problem Relation Age of Onset   Diabetes Mother        Type I   Lupus Mother    Allergies Father    Hypertension Maternal Grandmother    Stroke Maternal Grandmother    Diabetes Maternal Grandfather    Glaucoma Paternal Grandfather    Ovarian cancer Maternal Aunt     Social History   Socioeconomic History   Marital status: Single    Spouse name: Not on file   Number of children: Not on file   Years of education: Not on file   Highest education level: Not on file  Occupational History   Not on file  Tobacco Use   Smoking status: Never   Smokeless tobacco: Never  Vaping Use   Vaping Use: Never used  Substance and Sexual Activity   Alcohol use: Not Currently   Drug use: Never   Sexual activity: Yes    Partners: Male    Birth control/protection: Pill  Other Topics  Concern   Not on file  Social History Narrative   Not on file   Social Determinants of Health   Financial Resource Strain: Not on file  Food Insecurity: Not on file  Transportation Needs: Not on file  Physical Activity: Not on file  Stress: Not on file  Social Connections: Not on file  Intimate Partner Violence: Not on file    Review of Systems  All other systems reviewed and are negative.   PHYSICAL EXAMINATION:    BP 128/74   Pulse 62   Wt 151 lb (68.5 kg)   LMP 10/12/2022   SpO2 100%   BMI 29.74 kg/m     General appearance: alert, cooperative and appears stated age  Pelvic: External genitalia:  no lesions, + erythema              Urethra:  normal appearing urethra with no masses, tenderness or lesions              Bartholins and Skenes: normal                 Vagina: normal appearing vagina with normal color and discharge, no lesions              Cervix: no lesions               Chaperone, Gae Dry, CMA was present  for exam.

## 2022-10-29 NOTE — Patient Instructions (Signed)
Bacterial Vaginosis  Bacterial vaginosis is an infection that occurs when the normal balance of bacteria in the vagina changes. This change is caused by an overgrowth of certain bacteria in the vagina. Bacterial vaginosis is the most common vaginal infection among females aged 28 to 44 years. This condition increases the risk of sexually transmitted infections (STIs). Treatment can help reduce this risk. Treatment is very important for pregnant women because this condition can cause babies to be born early (prematurely) or at a low birth weight. What are the causes? This condition is caused by an increase in harmful bacteria that are normally present in small amounts in the vagina. However, the exact reason this condition develops is not known. You cannot get bacterial vaginosis from toilet seats, bedding, swimming pools, or contact with objects around you. What increases the risk? The following factors may make you more likely to develop this condition: Having a new sexual partner or multiple sexual partners, or having unprotected sex. Douching. Having an intrauterine device (IUD). Smoking. Abusing drugs and alcohol. This may lead to riskier sexual behavior. Taking certain antibiotic medicines. Being pregnant. What are the signs or symptoms? Some women with this condition have no symptoms. Symptoms may include: Gray or white vaginal discharge. The discharge can be watery or foamy. A fish-like odor with discharge, especially after sex or during menstruation. Itching in and around the vagina. Burning or pain with urination. How is this diagnosed? This condition is diagnosed based on: Your medical history. A physical exam of the vagina. Checking a sample of vaginal fluid for harmful bacteria or abnormal cells. How is this treated? This condition is treated with antibiotic medicines. These may be given as a pill, a vaginal cream, or a medicine that is put into the vagina (suppository). If  the condition comes back after treatment, a second round of antibiotics may be needed. Follow these instructions at home: Medicines Take or apply over-the-counter and prescription medicines only as told by your health care provider. Take or apply your antibiotic medicine as told by your health care provider. Do not stop using the antibiotic even if you start to feel better. General instructions If you have a female sexual partner, tell her that you have a vaginal infection. She should follow up with her health care provider. If you have a female sexual partner, he does not need treatment. Avoid sexual activity until you finish treatment. Drink enough fluid to keep your urine pale yellow. Keep the area around your vagina and rectum clean. Wash the area daily with warm water. Wipe yourself from front to back after using the toilet. If you are breastfeeding, talk to your health care provider about continuing breastfeeding during treatment. Keep all follow-up visits. This is important. How is this prevented? Self-care Do not douche. Wash the outside of your vagina with warm water only. Wear cotton or cotton-lined underwear. Avoid wearing tight pants and pantyhose, especially during the summer. Safe sex Use protection when having sex. This includes: Using condoms. Using dental dams. This is a thin layer of a material made of latex or polyurethane that protects the mouth during oral sex. Limit the number of sexual partners. To help prevent bacterial vaginosis, it is best to have sex with just one partner (monogamous relationship). Make sure you and your sexual partner are tested for STIs. Drugs and alcohol Do not use any products that contain nicotine or tobacco. These products include cigarettes, chewing tobacco, and vaping devices, such as e-cigarettes. If you need help   quitting, ask your health care provider. Do not use drugs. Do not drink alcohol if: Your health care provider tells you not  to do this. You are pregnant, may be pregnant, or are planning to become pregnant. If you drink alcohol: Limit how much you have to 0-1 drink a day. Be aware of how much alcohol is in your drink. In the [place] one drink equals one 12 oz bottle of beer (355 mL), one 5 oz glass of wine (148 mL), or one 1 oz glass of hard liquor (44 mL). Where to find more information Centers for Disease Control and Prevention: www.cdc.gov American Sexual Health Association (ASHA): www.ashastd.org U.S. Department of Health and Human Services, Office on Women's Health: www.womenshealth.gov Contact a health care provider if: Your symptoms do not improve, even after treatment. You have more discharge or pain when urinating. You have a fever or chills. You have pain in your abdomen or pelvis. You have pain during sex. You have vaginal bleeding between menstrual periods. Summary Bacterial vaginosis is a vaginal infection that occurs when the normal balance of bacteria in the vagina changes. It results from an overgrowth of certain bacteria. This condition increases the risk of sexually transmitted infections (STIs). Getting treated can help reduce this risk. Treatment is very important for pregnant women because this condition can cause babies to be born early (prematurely) or at low birth weight. This condition is treated with antibiotic medicines. These may be given as a pill, a vaginal cream, or a medicine that is put into the vagina (suppository). This information is not intended to replace advice given to you by your health care provider. Make sure you discuss any questions you have with your health care provider. Document Revised: 01/20/2020 Document Reviewed: 01/20/2020 Elsevier Patient Education  2023 Elsevier Inc. Vaginal Yeast Infection, Adult  Vaginal yeast infection is a condition that causes vaginal discharge as well as soreness, swelling, and redness (inflammation) of the vagina. This is a common  condition. Some women get this infection frequently. What are the causes? This condition is caused by a change in the normal balance of the yeast (Candida) and normal bacteria that live in the vagina. This change causes an overgrowth of yeast, which causes the inflammation. What increases the risk? The condition is more likely to develop in women who: Take antibiotic medicines. Have diabetes. Take birth control pills. Are pregnant. Douche often. Have a weak body defense system (immune system). Have been taking steroid medicines for a long time. Frequently wear tight clothing. What are the signs or symptoms? Symptoms of this condition include: White, thick, creamy vaginal discharge. Swelling, itching, redness, and irritation of the vagina. The lips of the vagina (labia) may be affected as well. Pain or a burning feeling while urinating. Pain during sex. How is this diagnosed? This condition is diagnosed based on: Your medical history. A physical exam. A pelvic exam. Your health care provider will examine a sample of your vaginal discharge under a microscope. Your health care provider may send this sample for testing to confirm the diagnosis. How is this treated? This condition is treated with medicine. Medicines may be over-the-counter or prescription. You may be told to use one or more of the following: Medicine that is taken by mouth (orally). Medicine that is applied as a cream (topically). Medicine that is inserted directly into the vagina (suppository). Follow these instructions at home: Take or apply over-the-counter and prescription medicines only as told by your health care provider. Do not use   tampons until your health care provider approves. Do not have sex until your infection has cleared. Sex can prolong or worsen your symptoms of infection. Ask your health care provider when it is safe to resume sexual activity. Keep all follow-up visits. This is important. How is this  prevented?  Do not wear tight clothes, such as pantyhose or tight pants. Wear breathable cotton underwear. Do not use douches, perfumed soap, creams, or powders. Wipe from front to back after using the toilet. If you have diabetes, keep your blood sugar levels under control. Ask your health care provider for other ways to prevent yeast infections. Contact a health care provider if: You have a fever. Your symptoms go away and then return. Your symptoms do not get better with treatment. Your symptoms get worse. You have new symptoms. You develop blisters in or around your vagina. You have blood coming from your vagina and it is not your menstrual period. You develop pain in your abdomen. Summary Vaginal yeast infection is a condition that causes discharge as well as soreness, swelling, and redness (inflammation) of the vagina. This condition is treated with medicine. Medicines may be over-the-counter or prescription. Take or apply over-the-counter and prescription medicines only as told by your health care provider. Do not douche. Resume sexual activity or use of tampons as instructed by your health care provider. Contact a health care provider if your symptoms do not get better with treatment or your symptoms go away and then return. This information is not intended to replace advice given to you by your health care provider. Make sure you discuss any questions you have with your health care provider. Document Revised: 10/09/2020 Document Reviewed: 10/09/2020 Elsevier Patient Education  2023 Elsevier Inc.  

## 2022-10-30 LAB — HSV(HERPES SIMPLEX VRS) I + II AB-IGG
HAV 1 IGG,TYPE SPECIFIC AB: <0.9 index
HSV 2 IGG,TYPE SPECIFIC AB: <0.9 index

## 2022-10-30 LAB — RPR: RPR Ser Ql: NONREACTIVE

## 2022-10-30 LAB — HIV ANTIBODY (ROUTINE TESTING W REFLEX): HIV 1&2 Ab, 4th Generation: NONREACTIVE

## 2022-10-30 LAB — SURESWAB CT/NG/T. VAGINALIS
C. trachomatis RNA, TMA: NOT DETECTED
N. gonorrhoeae RNA, TMA: NOT DETECTED
Trichomonas vaginalis RNA: NOT DETECTED

## 2022-10-30 LAB — HEPATITIS C ANTIBODY: Hepatitis C Ab: NONREACTIVE

## 2022-11-19 ENCOUNTER — Ambulatory Visit
Admission: RE | Admit: 2022-11-19 | Discharge: 2022-11-19 | Disposition: A | Discharge status: Home / Self Care | Payer: Managed Care, Other (non HMO) | Source: Ambulatory Visit | Attending: Nurse Practitioner | Admitting: Nurse Practitioner

## 2022-11-19 DIAGNOSIS — R1900 Intra-abdominal and pelvic swelling, mass and lump, unspecified site: Secondary | ICD-10-CM

## 2022-11-19 MED ORDER — GADOPICLENOL 0.5 MMOL/ML IV SOLN
7.0000 mL | Freq: Once | INTRAVENOUS | Status: AC | PRN
  Administered 2022-11-19: 7 mL via INTRAVENOUS

## 2022-11-20 ENCOUNTER — Telehealth: Payer: Self-pay | Admitting: *Deleted

## 2022-11-20 DIAGNOSIS — R1900 Intra-abdominal and pelvic swelling, mass and lump, unspecified site: Secondary | ICD-10-CM

## 2022-11-20 DIAGNOSIS — R188 Other ascites: Secondary | ICD-10-CM

## 2022-11-20 DIAGNOSIS — R935 Abnormal findings on diagnostic imaging of other abdominal regions, including retroperitoneum: Secondary | ICD-10-CM

## 2022-11-20 NOTE — Telephone Encounter (Signed)
Erskine Squibb from [facility] called regarding MRI Abdomen and MRI pelvis completed on 11/19/22.   Results in EPIC,   IMPRESSION: 1. Moderate volume of loculated appearing, septated fluid throughout the abdomen and pelvis, with mild, although perceptible peritoneal thickening and enhancement. No discretely visualized nodularity. This is of uncertain significance, given patient age, peritoneal carcinomatosis generally less favored. Differential considerations, in general, would include abdominal pseudocyst, if there is history of ventriculoperitoneal shunting, chronic sequelae of infection such as pelvic inflammatory disease, pseudomyxoma peritonei as sequelae of mucinous adenocarcinoma of the appendix, or other etiology of peritoneal carcinomatosis such as ovarian malignancy. Consider diagnostic paracentesis. 2. There is however no direct evidence of mass or lymphadenopathy in the abdomen or pelvis. 3. Large burden of stool in the colon.    Routing to [place], NP

## 2022-11-22 NOTE — Telephone Encounter (Signed)
-----   Message from Genia Del, MD sent at 11/21/2022  4:55 PM EDT ----- MRI of Abdomen and pelvis: IMPRESSION: 1. Moderate volume of loculated appearing, septated fluid throughout the abdomen and pelvis, with mild, although perceptible peritoneal thickening and enhancement. No discretely visualized nodularity. This is of uncertain significance, given patient age, peritoneal carcinomatosis generally less favored. Differential considerations, in general, would include abdominal pseudocyst, if there is history of ventriculoperitoneal shunting, chronic sequelae of infection such as pelvic inflammatory disease, pseudomyxoma peritonei as sequelae of mucinous adenocarcinoma of the appendix, or other etiology of peritoneal carcinomatosis such as ovarian malignancy. Consider diagnostic paracentesis. 2. There is however no direct evidence of mass or lymphadenopathy in the abdomen or pelvis. 3. Large burden of stool in the colon.   Recommend referral to General Surgery asap.

## 2022-11-22 NOTE — Telephone Encounter (Signed)
  Leda Min, RN 11/22/2022  3:31 PM EDT Back to Top    Left message to call Noreene Larsson, RN at [place], [phone], option 5. No alternative number on file.

## 2022-11-22 NOTE — Telephone Encounter (Signed)
Second Call placed to patient. Left detailed message, ok per dpr. Advised it is Friday afternoon and our office phone go off at 4:15, will leave detailed message. Advised patient MRI of abdomen abnormal, urgent referral to general surgeon recommended by Dr. Seymour Bars. Advised I have placed an urgent referral to [facility], their office will return call to schedule. Please return call to office at [phone], Option 5 to further discuss and confirm message received.   Epic staff message to [facility] referral Pool requesting scheduling for surgery referral.  Bianca cc'd for f/u.   Call placed to [facility] at [phone], left detailed message on referral line advising of referral. Requested [facility] to contact patient to schedule ASAP. Referral also faxed to [phone].   MyChart message to patient.

## 2022-11-22 NOTE — Telephone Encounter (Signed)
Last read by Iverson Alamin at  4:34 PM on 11/22/2022.

## 2022-11-26 NOTE — Telephone Encounter (Signed)
Spoke with patient. Patient returning call to further discuss results, requesting return call from Dr. Seymour Bars or Elmarie Shiley.   Update provided on referral, currently being reviewed by surgeons at [facility], they will contact you directly to schedule. Patient verbalizes understanding and appreciative of return call.    Dr. Seymour Bars  or Tiffany -please contact patient to discuss MRI results.

## 2022-11-27 NOTE — Telephone Encounter (Signed)
Spoke with patient. See telephone encounter.

## 2022-11-27 NOTE — Telephone Encounter (Signed)
Spoke with patient per request. Reviewed MRI results and recommendations for general surgery referral. Aware that general surgery has received referral but has reached out to gyn oncology as well to determine best next steps. All questions answered. Also recommend starting bowel regimen for large amount of stool burden seen on MRI. She does report intermittent constipation.

## 2022-11-28 ENCOUNTER — Telehealth: Payer: Self-pay | Admitting: *Deleted

## 2022-11-28 DIAGNOSIS — R1900 Intra-abdominal and pelvic swelling, mass and lump, unspecified site: Secondary | ICD-10-CM

## 2022-11-28 DIAGNOSIS — R188 Other ascites: Secondary | ICD-10-CM

## 2022-11-28 DIAGNOSIS — R935 Abnormal findings on diagnostic imaging of other abdominal regions, including retroperitoneum: Secondary | ICD-10-CM

## 2022-11-28 NOTE — Telephone Encounter (Signed)
Urgent referral placed.  Call placed to patient, advised regarding referral. Advised new referral placed, their office will call to schedule. Our office referral coordinator will f/u on referral. Return call to office if any additional questions.   Routing to [place] to f/u on urgent referral.           RE: URGENT Referral Received: Today Genia Del, MD  Joaquim Lai; Leda Min, RN; Olivia Mackie, NP Please schedule patient with Dr Lenis Noon at [facility]. Dr Elbert Ewings       Previous Messages    ----- Message ----- From: Joaquim Lai Sent: 11/28/2022   1:47 PM EDT To: Genia Del, MD; Leda Min, RN; * Subject: RE: URGENT Referral                             Hey there! Dr. Freida Busman and Dr. Donell Beers recommend that this pt see Dr. Lenis Noon at [facility].

## 2022-11-29 NOTE — Telephone Encounter (Signed)
Referral faxed to [phone].

## 2022-12-04 NOTE — Telephone Encounter (Signed)
Brittany Dixon -can you provide update on urgent referral?

## 2022-12-05 NOTE — Telephone Encounter (Signed)
Outbound call to Dr. Chelsea Primus office where they stated that Dr. Tresa Endo would be the appropriate provider to see the patient.  Informed them it is for an urgent referral.  Faxed it to [phone].  They stated to let the patient know if they do not call her by 12/06/22 she can give them a call Monday.  Called the pt and lvm to inform.

## 2022-12-10 NOTE — Telephone Encounter (Signed)
Patient is scheduled for 12/16/2022.

## 2022-12-10 NOTE — Telephone Encounter (Signed)
Brittany Dixon -can you f/u with patient to confirm she is scheduled?

## 2022-12-10 NOTE — Telephone Encounter (Signed)
Routing to Dr. Kearney Hard.   Cc: Tiffany, NP  Encounter closed.

## 2022-12-23 ENCOUNTER — Telehealth: Payer: Self-pay | Admitting: *Deleted

## 2022-12-23 NOTE — Telephone Encounter (Signed)
Patient left message on surgery line on 12/20/22 requesting return call about referral.   Routing to [facility] to f/u with patient.

## 2022-12-23 NOTE — Telephone Encounter (Signed)
Patient has an appt for 01/06/23 at 3:45pm with Dr. Tresa Endo.  LVM for patient to call back to inform.

## 2023-01-10 NOTE — Telephone Encounter (Signed)
Spoke with pt who stated she had her follow-up appt with Dr. Tresa Endo who is wanting her to have a paracentesis.

## 2023-01-10 NOTE — Telephone Encounter (Signed)
Brittany Dixon -can you f/u with this patient since she never called back.

## 2023-01-16 NOTE — Telephone Encounter (Signed)
Per review of EPIC, patient had consult with GYN ONC at [facility], IR Paracentesis scheduled and later cancelled by patient.   Call placed to patient, Left message to call Noreene Larsson, RN at [place], [phone], option 5. F/u on referral placed to GYN ONC for pelvic mass. Return call to further discuss.   Routing to [place], [employer].

## 2023-02-18 ENCOUNTER — Encounter: Payer: Self-pay | Admitting: Nurse Practitioner

## 2023-02-18 DIAGNOSIS — R935 Abnormal findings on diagnostic imaging of other abdominal regions, including retroperitoneum: Secondary | ICD-10-CM

## 2023-02-18 DIAGNOSIS — R188 Other ascites: Secondary | ICD-10-CM

## 2023-02-18 DIAGNOSIS — R1900 Intra-abdominal and pelvic swelling, mass and lump, unspecified site: Secondary | ICD-10-CM

## 2023-02-18 NOTE — Telephone Encounter (Signed)
Since no response back from [facility], I called her. She confirmed that it was staff that she did not have good experience with. They were either rude to her or hasty with her over the phone and would rather go to another office entirely.

## 2023-02-18 NOTE — Telephone Encounter (Signed)
Requesting new Gyn Onc provider. Please discuss with patient and see if she is OK with seeing someone within that practice as there are multiple others. Patient needs to be seen urgently.

## 2023-02-19 NOTE — Telephone Encounter (Signed)
May refer to different gyn onc office per request. Thanks.

## 2023-02-24 NOTE — Telephone Encounter (Signed)
See MyChart encounter dated 02/18/23.   Routing to [place], [employer].   Encounter closed.

## 2023-02-24 NOTE — Telephone Encounter (Signed)
Referral placed to [facility].   Routing to [place] to assist with referral and notify patient.

## 2023-03-05 ENCOUNTER — Telehealth: Payer: Self-pay

## 2023-03-05 NOTE — Telephone Encounter (Signed)
Marena Chancy -can you provide update on referral?

## 2023-03-05 NOTE — Telephone Encounter (Signed)
Left message for patient to call back and schedule new patient appointment.  Trying to schedule her for 8/2 with Dr Pricilla Holm

## 2023-03-06 NOTE — Telephone Encounter (Signed)
Routing to Tiffany FYI.   Encounter closed.  

## 2023-03-06 NOTE — Telephone Encounter (Signed)
Patient is scheduled for 03/20/2023.

## 2023-03-14 ENCOUNTER — Encounter: Payer: Self-pay | Admitting: Gynecologic Oncology

## 2023-03-19 NOTE — Progress Notes (Unsigned)
GYNECOLOGIC ONCOLOGY NEW PATIENT CONSULTATION   Patient Name: Brittany Dixon  Patient Age: 27 y.o. Date of Service: 03/20/23 Referring Provider: Wyline Beady, NP  Primary Care Provider: Karle Starch, MD Consulting Provider: Eugene Garnet, MD   Assessment/Plan:  Premenopausal patient with loculated abdominal and pelvic fluid of unknown etiology.  The patient and I discussed imaging findings on both CT scan in February of this year as well as follow-up MRI that show loculated fluid within the abdomen and pelvis.  Bilateral ovaries appear to be within 2 of these fluid collections although the fluid collections do not appear to be ovarian cyst themselves.  There is also some small amount of free fluid within the cul-de-sac.  Although patient has some abdominal symptoms, it is difficult to discern whether these are related to GI function or these fluid collections.  We discussed differential diagnosis for imaging findings.  She has a history of chlamydia infections, both treated, but we discussed that pelvic inflammatory disease caused by an STD could potentially cause such findings.  While GYN or other malignancy (eg appendiceal) could be the cause, the patient had normal tumor markers earlier this year and has no definitive evidence of a primary tumor or imaging findings concerning for metastatic disease.  For diagnostic purposes, I recommend that we proceed with paracentesis.  This was ordered today.  Will send the fluid for culture, Gram stain, cell counts, and cytology.  Any follow-up treatment will be dependent on paracentesis results.  The patient has a history of GI surgery as a child with 2 large scars on her abdomen.  Given this history, she likely has some increased risk from a surgical morbidity standpoint.  The patient appears to be due for cervical cancer screening.  Pap test was performed today.  I will let her know these results in MyChart once available.  A copy of this  note was sent to the patient's referring provider.   60 minutes of total time was spent for this patient encounter, including preparation, face-to-face counseling with the patient and coordination of care, and documentation of the encounter.  Eugene Garnet, MD  Division of Gynecologic Oncology  Department of Obstetrics and Gynecology  [facility]  ___________________________________________  Chief Complaint: Chief Complaint  Patient presents with   Pelvic mass    History of Present Illness:  Brittany Dixon is a 27 y.o. y.o. female who is seen in consultation at the request of Wyline Beady, NP for an evaluation of abdominal fluid collections.  After the patient developed left lower quadrant abdominal pain, she underwent pelvic ultrasound at [facility] in October 2023 with normal-appearing uterus and bilateral adnexa.  No masses noted.  Large amount of free fluid in the mid to lower abdomen seen.  STD testing in October was notable for Chlamydia. Repeat testing after treatment in November was negative.  CT of the abdomen and pelvis was performed on 09/24/2022 and showed unremarkable uterus.  Bilateral ovaries contained within the loculated pelvic fluid collections with collection measuring 12.1 x 4.1 cm.  Fluid collections nonspecific, possibly reflecting peritoneal inclusion cyst, lymphangiomata, or ovarian cystic lesions.  Moderate volume of formed stool noted in distal colon.    In late February, CEA was obtained and normal at less than 2.  CA125 was 24.  Repeat STD testing in March was negative including trichomonas, gonorrhea, herpes, HIV, and syphilis.  MRI of the abdomen and pelvis was subsequently performed on 11/19/2022 and revealed a moderate volume  of loculated appearing, septated fluid throughout the abdomen and pelvis with mild, although perceptible peritoneal thickening and enhancement.  No discretely visualized nodularity.   Given patient's age, peritoneal carcinomatosis less favored.  Differential includes abdominal pseudocyst, chronic sequelae of infection such as PID, pseudomyxoma peritonei.  No direct evidence of mass or lymphadenopathy in the abdomen or pelvis.  Large burden of stool noted.  The patient was initially referred to [facility] surgery who recommended that the patient be seen by Dr. Lenis Noon at [facility].  The patient ultimately had a consultation with Dr. Tresa Endo at [facility]. Diagnostic paracentesis was recommended. The procedure was not ultimately performed.  Review of prior CT A/P at [facility] in 10/2020 showed small volume of free fluid within bilateral adnexa and the posterior cul de sac.   Today, the patient reports she is in no pain currently.  She endorses intense cramping or pulsing pain every day during her cycle.  Randomly, on average once every 2 days, she will have 1 or 2 minutes of similar symptoms when she is not menstruating.  She does not require any medications for this discomfort.  She has some baseline constipation, uses MiraLAX as needed.  She underwent an intestinal surgery when she was younger that she describes as having portion of her colon removed and reconstructed.  She denies any urinary symptoms.  She endorses a good appetite without nausea or emesis.  In terms of her GYN history, she notes normal menses that last for 4-6 days.  She occasionally has intermenstrual spotting.  She is not currently on birth control.  Medical history is notable for type 1 diabetes.  She uses a meter as well as correctional insulin.  Blood sugars tend to be highest around lunchtime and can be 180-200s.  PAST MEDICAL HISTORY:  Past Medical History:  Diagnosis Date   Allergy    seasonal allergies   Diabetes mellitus without complication (HCC)    Hypertension    STD (sexually transmitted disease) 2018   Tx'd for Chlamydia     PAST SURGICAL HISTORY:  Past Surgical History:  Procedure Laterality Date    COLON SURGERY     as an infant   INCISE AND DRAIN ABCESS     on abdomen, as a child    OB/GYN HISTORY:  OB History  Gravida Para Term Preterm AB Living  0 0 0 0 0 0  SAB IAB Ectopic Multiple Live Births  0 0 0 0 0    No LMP recorded.  Age at menarche: 71 Hx of STDs: CT as per HPI  Last pap: 09/2019 - [facility] History of abnormal pap smears: denies  SCREENING STUDIES:  Last mammogram: n/a  Last colonoscopy: 2022  MEDICATIONS: Outpatient Encounter Medications as of 03/20/2023  Medication Sig   famotidine (PEPCID) 40 MG tablet Take 40 mg by mouth 2 (two) times daily.   insulin aspart (NOVOLOG FLEXPEN) 100 UNIT/ML FlexPen INJECT SUBCUTANEOUSLY AS DIRECTED UP TO MAX DAILY DOSE OF 75 UNITS **PT NEEDS FOLLOW UP APPT WITH DR Elvera Lennox**   Insulin Glargine (LANTUS SOLOSTAR) 100 UNIT/ML Solostar Pen Inject 28 Units into the skin daily at 10 pm. **PT NEEDS FOLLOW UP APPT WITH DR GHERGHE**   losartan (COZAAR) 100 MG tablet Take 100 mg by mouth daily.   Multiple Vitamin (MULTIVITAMIN) tablet Take 1 tablet by mouth daily. Natures Made   [DISCONTINUED] betamethasone valerate ointment (VALISONE) 0.1 % Apply 1 Application topically 2 (two) times daily.   [DISCONTINUED] Drospirenone (SLYND) 4 MG TABS Take  1 tablet by mouth daily.   [DISCONTINUED] metroNIDAZOLE (FLAGYL) 500 MG tablet Take 1 tablet (500 mg total) by mouth 2 (two) times daily.   No facility-administered encounter medications on file as of 03/20/2023.    ALLERGIES:  No Known Allergies   FAMILY HISTORY:  Family History  Problem Relation Age of Onset   Diabetes Mother        Type I   Lupus Mother    Allergies Father    Ovarian cancer Maternal Aunt    Hypertension Maternal Grandmother    Stroke Maternal Grandmother    Diabetes Maternal Grandfather    Glaucoma Paternal Grandfather    Colon cancer Neg Hx    Breast cancer Neg Hx    Endometrial cancer Neg Hx    Pancreatic cancer Neg Hx    Prostate cancer Neg Hx      SOCIAL  HISTORY:  Social Connections: Unknown (12/18/2021)   Received from [employer], [facility]   Social Network    Social Network: Not on file    REVIEW OF SYSTEMS:  + Unexplained weight changes, bloating, constipation, pelvic pain, muscle pain/cramp Denies appetite changes, fevers, chills, fatigue. Denies hearing loss, neck lumps or masses, mouth sores, ringing in ears or voice changes. Denies cough or wheezing.  Denies shortness of breath. Denies chest pain or palpitations. Denies leg swelling. Denies abdominal pain, blood in stools, diarrhea, nausea, vomiting, or early satiety. Denies pain with intercourse, dysuria, frequency, hematuria or incontinence. Denies hot flashes, vaginal bleeding or vaginal discharge.   Denies joint pain, back pain. Denies itching, rash, or wounds. Denies dizziness, headaches, numbness or seizures. Denies swollen lymph nodes or glands, denies easy bruising or bleeding. Denies anxiety, depression, confusion, or decreased concentration.  Physical Exam:  Vital Signs for this encounter:  Blood pressure (!) 155/85, pulse (!) 115, temperature 98.7 F (37.1 C), resp. rate 16, weight 161 lb (73 kg), SpO2 100%. Body mass index is 31.71 kg/m. General: Alert, oriented, no acute distress.  HEENT: Normocephalic, atraumatic. Sclera anicteric.  Chest: Clear to auscultation bilaterally. No wheezes, rhonchi, or rales. Cardiovascular: Regular rate and rhythm, II/VI systolic murmur; no rubs, or gallops.  Abdomen: Normoactive bowel sounds. Soft, nondistended, nontender to palpation. No masses or hepatosplenomegaly appreciated.  Some fluid wave noted, more on the left than the right.  This is centered in the mid abdomen.  Prior, well-healed surgical scars. Extremities: Grossly normal range of motion. Warm, well perfused. No edema bilaterally.  Skin: No rashes or lesions.  Lymphatics: No cervical, supraclavicular, or inguinal adenopathy.  GU:  Normal external female  genitalia.   No lesions. No discharge or bleeding.             Bladder/urethra:  No lesions or masses, well supported bladder             Vagina: Well-rugated, no lesions or masses.             Cervix: Normal appearing, no lesions.             Uterus: Small, mobile, no parametrial involvement or nodularity.  Fluid appreciated within the cul-de-sac.             Adnexa: No masses.  Rectal: Deferred.  LABORATORY AND RADIOLOGIC DATA:  Outside medical records were reviewed to synthesize the above history, along with the history and physical obtained during the visit.   Lab Results  Component Value Date   WBC 5.5 06/04/2022   HGB 12.2 06/04/2022   HCT  40.3 06/04/2022   PLT 486 (H) 06/04/2022   GLUCOSE 367 (H) 06/04/2022   ALT 17 06/04/2022   AST 14 06/04/2022   NA 137 06/04/2022   K 4.2 06/04/2022   CL 103 06/04/2022   CREATININE 0.86 06/04/2022   BUN 12 06/04/2022   CO2 23 06/04/2022   TSH 1.11 06/04/2022   HGBA1C 11.9 (H) 10/05/2013   MICROALBUR 1.0 10/05/2013

## 2023-03-20 ENCOUNTER — Other Ambulatory Visit (HOSPITAL_COMMUNITY)
Admission: RE | Admit: 2023-03-20 | Discharge: 2023-03-20 | Disposition: A | Discharge status: Home / Self Care | Payer: Managed Care, Other (non HMO) | Source: Ambulatory Visit

## 2023-03-20 ENCOUNTER — Inpatient Hospital Stay: Payer: Managed Care, Other (non HMO) | Attending: Gynecologic Oncology | Admitting: Gynecologic Oncology

## 2023-03-20 ENCOUNTER — Other Ambulatory Visit: Payer: Self-pay

## 2023-03-20 ENCOUNTER — Encounter: Payer: Self-pay | Admitting: Gynecologic Oncology

## 2023-03-20 VITALS — BP 155/85 | HR 115 | Temp 98.7°F | Resp 16 | Wt 161.0 lb

## 2023-03-20 DIAGNOSIS — Z124 Encounter for screening for malignant neoplasm of cervix: Secondary | ICD-10-CM | POA: Insufficient documentation

## 2023-03-20 DIAGNOSIS — Z8041 Family history of malignant neoplasm of ovary: Secondary | ICD-10-CM | POA: Insufficient documentation

## 2023-03-20 DIAGNOSIS — Z794 Long term (current) use of insulin: Secondary | ICD-10-CM | POA: Insufficient documentation

## 2023-03-20 DIAGNOSIS — I10 Essential (primary) hypertension: Secondary | ICD-10-CM | POA: Insufficient documentation

## 2023-03-20 DIAGNOSIS — Z79899 Other long term (current) drug therapy: Secondary | ICD-10-CM | POA: Diagnosis not present

## 2023-03-20 DIAGNOSIS — R188 Other ascites: Secondary | ICD-10-CM | POA: Insufficient documentation

## 2023-03-20 DIAGNOSIS — Z8742 Personal history of other diseases of the female genital tract: Secondary | ICD-10-CM | POA: Diagnosis not present

## 2023-03-20 DIAGNOSIS — E109 Type 1 diabetes mellitus without complications: Secondary | ICD-10-CM | POA: Insufficient documentation

## 2023-03-20 DIAGNOSIS — K5900 Constipation, unspecified: Secondary | ICD-10-CM | POA: Insufficient documentation

## 2023-03-20 DIAGNOSIS — Z8619 Personal history of other infectious and parasitic diseases: Secondary | ICD-10-CM

## 2023-03-20 DIAGNOSIS — R1900 Intra-abdominal and pelvic swelling, mass and lump, unspecified site: Secondary | ICD-10-CM

## 2023-03-20 NOTE — Patient Instructions (Addendum)
It was very nice to meet you today.  I placed the referral to get you scheduled for the procedure we discussed today to remove some of the fluid from the collections in your abdomen.  This may help you feel a little better but will also help was diagnosed what was the cause of these fluid collections developing.  I will give you a call once I have the results from that procedure to help determine next steps.  Since it looks like you are also due for a Pap test, this was performed today.  Results are typically back within a week.

## 2023-03-21 ENCOUNTER — Other Ambulatory Visit: Payer: Self-pay | Admitting: Gynecologic Oncology

## 2023-03-21 ENCOUNTER — Ambulatory Visit
Admission: EM | Admit: 2023-03-21 | Discharge: 2023-03-21 | Disposition: A | Discharge status: Home / Self Care | Payer: Managed Care, Other (non HMO) | Source: Ambulatory Visit | Attending: Gynecologic Oncology | Admitting: Gynecologic Oncology

## 2023-03-21 DIAGNOSIS — R188 Other ascites: Secondary | ICD-10-CM | POA: Insufficient documentation

## 2023-03-21 DIAGNOSIS — Z8619 Personal history of other infectious and parasitic diseases: Secondary | ICD-10-CM

## 2023-03-21 DIAGNOSIS — Z124 Encounter for screening for malignant neoplasm of cervix: Secondary | ICD-10-CM

## 2023-03-21 DIAGNOSIS — R1900 Intra-abdominal and pelvic swelling, mass and lump, unspecified site: Secondary | ICD-10-CM

## 2023-03-21 LAB — BODY FLUID CELL COUNT WITH DIFFERENTIAL
Eos, Fluid: 0 %
Lymphs, Fluid: 0 %
Monocyte-Macrophage-Serous Fluid: 99 %
Neutrophil Count, Fluid: 1 %
Total Nucleated Cell Count, Fluid: 2227 cu mm

## 2023-03-21 LAB — PROTEIN, PLEURAL OR PERITONEAL FLUID: Total protein, fluid: 4.5 g/dL

## 2023-03-21 LAB — LACTATE DEHYDROGENASE, PLEURAL OR PERITONEAL FLUID: LD, Fluid: 122 U/L — ABNORMAL HIGH (ref 3–23)

## 2023-03-21 MED ORDER — LIDOCAINE HCL (PF) 1 % IJ SOLN
10.0000 mL | Freq: Once | INTRAMUSCULAR | Status: AC
  Administered 2023-03-21: 10 mL via INTRADERMAL
  Filled 2023-03-21: qty 10

## 2023-03-24 LAB — CYTOLOGY - PAP: Diagnosis: NEGATIVE

## 2023-03-26 ENCOUNTER — Inpatient Hospital Stay: Payer: Managed Care, Other (non HMO) | Admitting: Gynecologic Oncology

## 2023-03-26 ENCOUNTER — Encounter: Payer: Self-pay | Admitting: Gynecologic Oncology

## 2023-03-26 DIAGNOSIS — R188 Other ascites: Secondary | ICD-10-CM

## 2023-03-26 LAB — AEROBIC/ANAEROBIC CULTURE W GRAM STAIN (SURGICAL/DEEP WOUND): Culture: NO GROWTH

## 2023-03-26 NOTE — Progress Notes (Unsigned)
Did not pick up during time of scheduled phone visit. Left message with callback requested. Called again at 16:55, no answer.   Eugene Garnet MD Gynecologic Oncology

## 2023-04-10 ENCOUNTER — Telehealth: Payer: Self-pay | Admitting: Gynecologic Oncology

## 2023-04-10 NOTE — Telephone Encounter (Signed)
I called the patient several times this week, including this afternoon.  No answer.  Left a voicemail again this afternoon.  I will send her a MyChart message.  May be best to set up a time for a phone visit so that we do not keep missing each other.  Eugene Garnet MD Gynecologic Oncology

## 2023-06-03 ENCOUNTER — Ambulatory Visit: Payer: Managed Care, Other (non HMO) | Admitting: Nurse Practitioner

## 2023-06-23 ENCOUNTER — Ambulatory Visit: Payer: Managed Care, Other (non HMO) | Admitting: Nurse Practitioner

## 2023-06-23 ENCOUNTER — Encounter: Payer: Self-pay | Admitting: Nurse Practitioner

## 2023-06-23 VITALS — BP 142/92 | HR 104

## 2023-06-23 DIAGNOSIS — I10 Essential (primary) hypertension: Secondary | ICD-10-CM

## 2023-06-23 DIAGNOSIS — N760 Acute vaginitis: Secondary | ICD-10-CM | POA: Diagnosis not present

## 2023-06-23 DIAGNOSIS — N898 Other specified noninflammatory disorders of vagina: Secondary | ICD-10-CM

## 2023-06-23 DIAGNOSIS — Z113 Encounter for screening for infections with a predominantly sexual mode of transmission: Secondary | ICD-10-CM

## 2023-06-23 DIAGNOSIS — B9689 Other specified bacterial agents as the cause of diseases classified elsewhere: Secondary | ICD-10-CM | POA: Diagnosis not present

## 2023-06-23 LAB — WET PREP FOR TRICH, YEAST, CLUE

## 2023-06-23 MED ORDER — METRONIDAZOLE 0.75 % VA GEL
1.0000 | Freq: Every day | VAGINAL | 0 refills | Status: AC
Start: 2023-06-23 — End: 2023-06-28

## 2023-06-23 NOTE — Progress Notes (Signed)
   Acute Office Visit  Subjective:    Patient ID: Brittany Dixon, female    DOB: [birth year], 27 y.o.   MRN: [id number]   HPI 27 y.o. presents today for vaginal odor, discharge and irritation x 1 month. Would like STD screening. HTN managed by PCP.  Patient's last menstrual period was 06/08/2023 (approximate).    Review of Systems  Constitutional: Negative.   Genitourinary:  Positive for vaginal discharge and vaginal pain (Irritation).       Vaginal odor       Objective:    Physical Exam Constitutional:      Appearance: Normal appearance.  Genitourinary:    General: Normal vulva.     Vagina: Vaginal discharge present. No erythema.     Cervix: Normal.     BP (!) 142/92   Pulse (!) 104   LMP 06/08/2023 (Approximate)   SpO2 98%  Wt Readings from Last 3 Encounters:  03/20/23 161 lb (73 kg)  10/29/22 151 lb (68.5 kg)  01/14/22 154 lb (69.9 kg)        Patient informed chaperone available to be present for breast and/or pelvic exam. Patient has requested no chaperone to be present. Patient has been advised what will be completed during breast and pelvic exam.   Wet prep + clue cells (+ odor)  Assessment & Plan:   Problem List Items Addressed This Visit       Cardiovascular and Mediastinum   Primary hypertension   Other Visit Diagnoses     Bacterial vaginosis    -  Primary   Relevant Medications   metroNIDAZOLE (METROGEL) 0.75 % vaginal gel   Vaginal discharge       Relevant Orders   WET PREP FOR TRICH, YEAST, CLUE   Screening examination for STD (sexually transmitted disease)       Relevant Orders   C. trachomatis/N. gonorrhoeae RNA      Plan: Wet prep positive for clue cells - Metrogel 0.75% nightly x 5 nights. GC/CG pending. BP elevated today. Follow up with PCP.   Return if symptoms worsen or fail to improve.    Olivia Mackie DNP, 11:34 AM 06/23/2023

## 2023-06-24 LAB — C. TRACHOMATIS/N. GONORRHOEAE RNA
C. trachomatis RNA, TMA: NOT DETECTED
N. gonorrhoeae RNA, TMA: NOT DETECTED

## 2023-07-17 IMAGING — MR MR KNEE*L* W/O CM
5 of 7 series · 23 of 40 positions shown · non-contrast
Comparison: None.

CLINICAL DATA: Fell and injured knee 6 days ago.

EXAM:
MRI OF THE LEFT KNEE WITHOUT CONTRAST
TECHNIQUE: Multiplanar, multisequence MR imaging of the knee was performed. No
intravenous contrast was administered.

[Series 3: T2 fat-sat · axial · 4.0mm · 0.62mm/px · z∈[-46,+84]mm · 6 of 27 slices shown (1 of 2)]
[im 1/27]
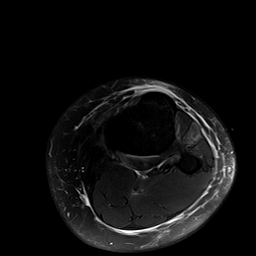
[im 6/27]
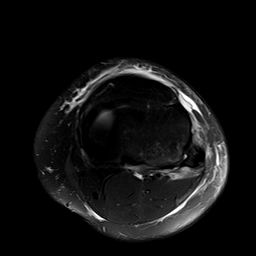
[im 11/27]
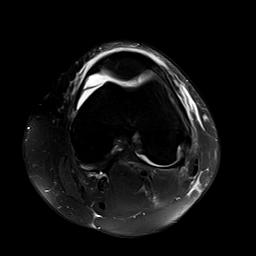
[im 16/27]
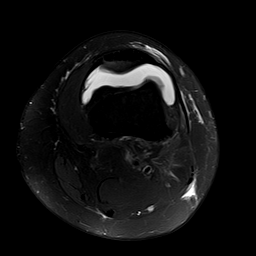
[im 21/27]
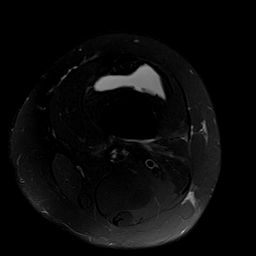
[im 27/27]
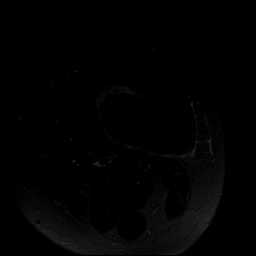

[Series 5: T2 fat-sat · coronal · 4.0mm · 0.31mm/px · 1 of 28 slices shown (2 of 2)]
[im 1/28]
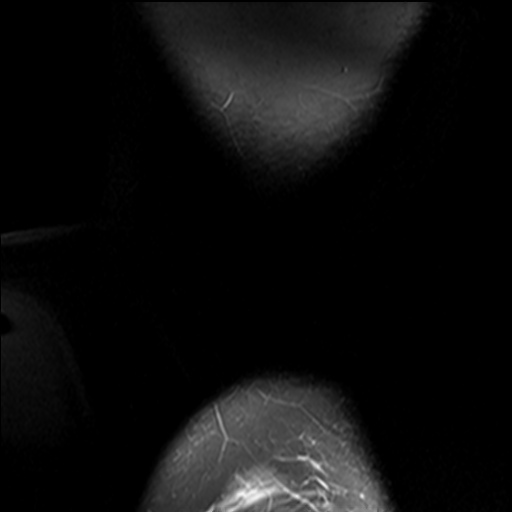

[Series 6: PD fat-sat · coronal · 3.0mm · 0.31mm/px · 7 of 35 slices shown (1 of 3)]
[im 1/35]
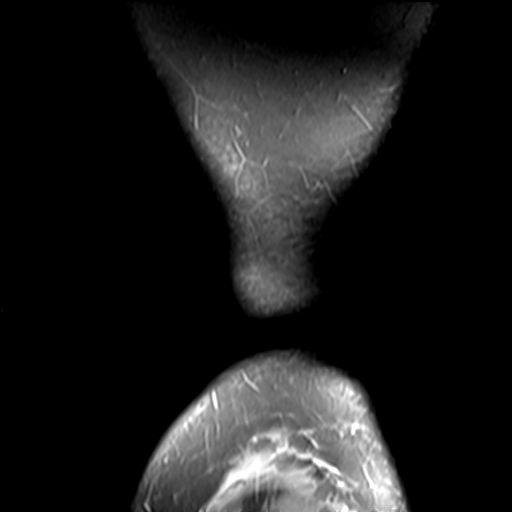
[im 6/35]
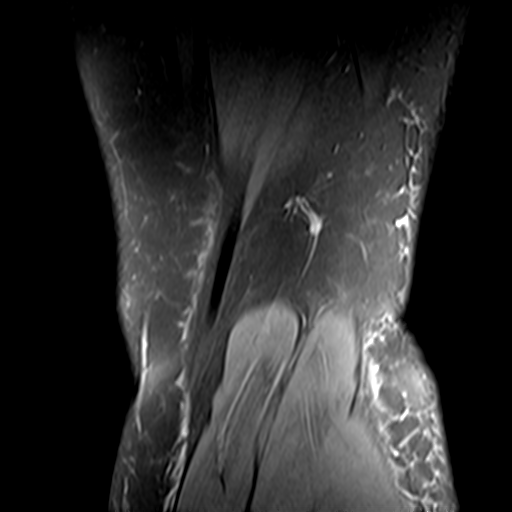
[im 12/35]
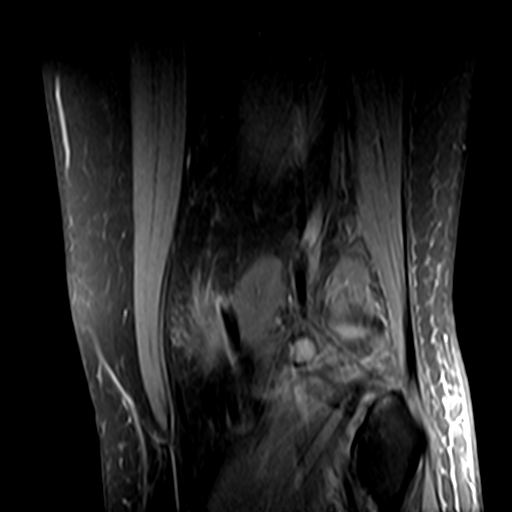
[im 18/35]
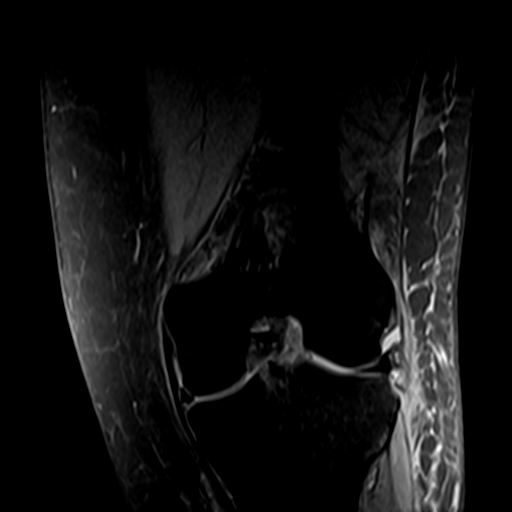
[im 23/35]
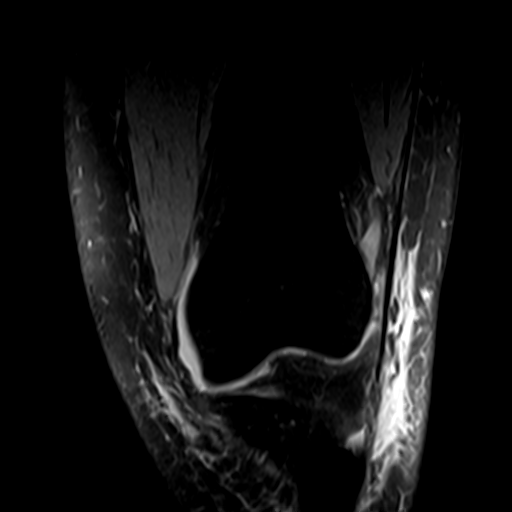
[im 29/35]
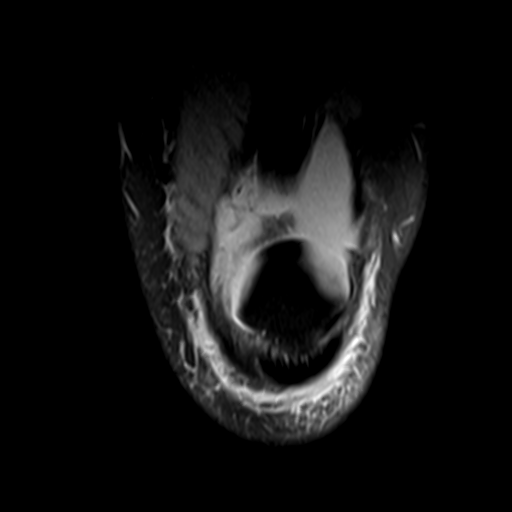
[im 35/35]
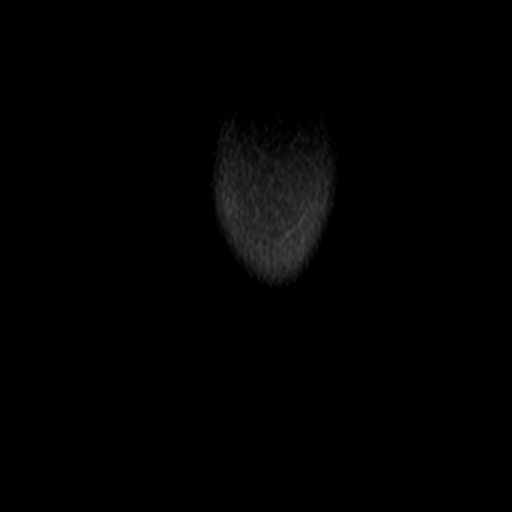

[Series 8: PD fat-sat · sagittal · 3.0mm · 0.31mm/px · 6 of 29 slices shown (2 of 3)]
[im 1/29]
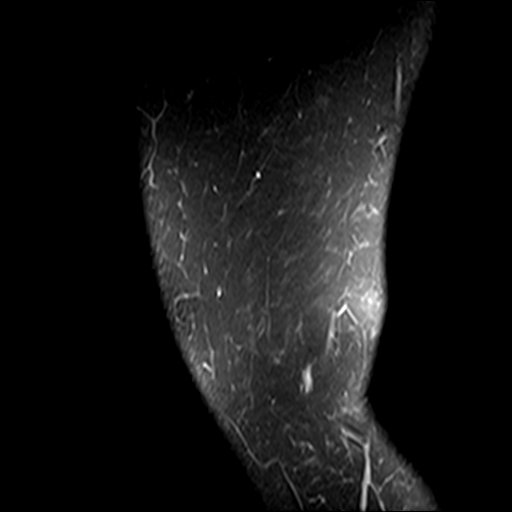
[im 6/29]
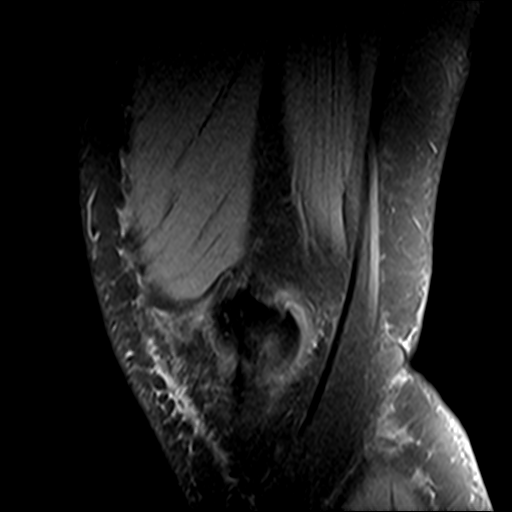
[im 12/29]
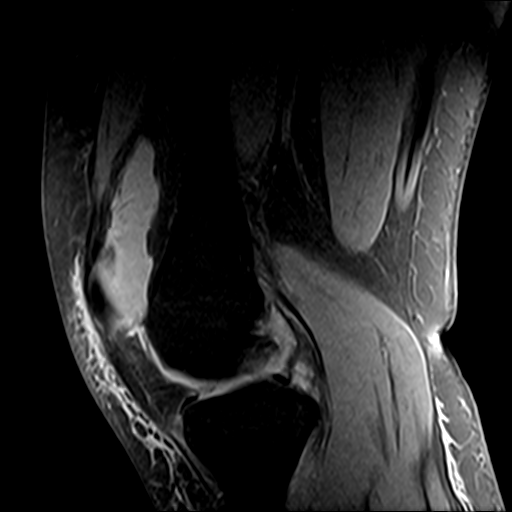
[im 17/29]
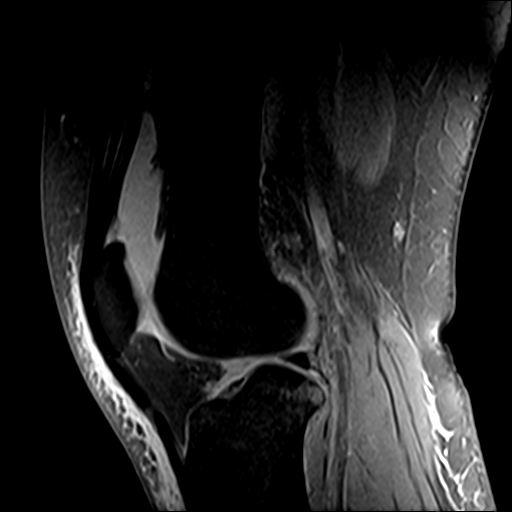
[im 23/29]
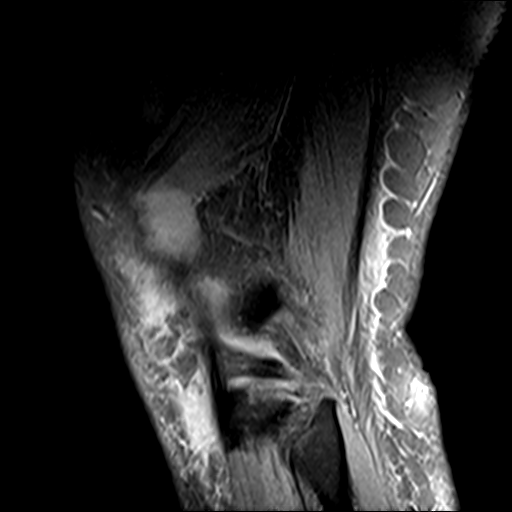
[im 29/29]
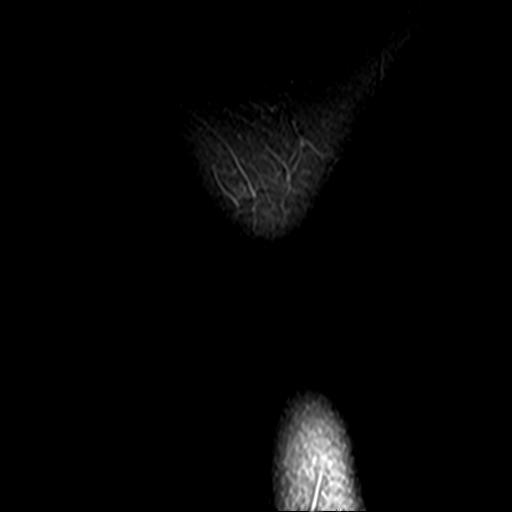

[Series 9: PD fat-sat · oblique · 2.3mm · 0.29mm/px · 3 of 14 slices shown (3 of 3)]
[im 1/14]
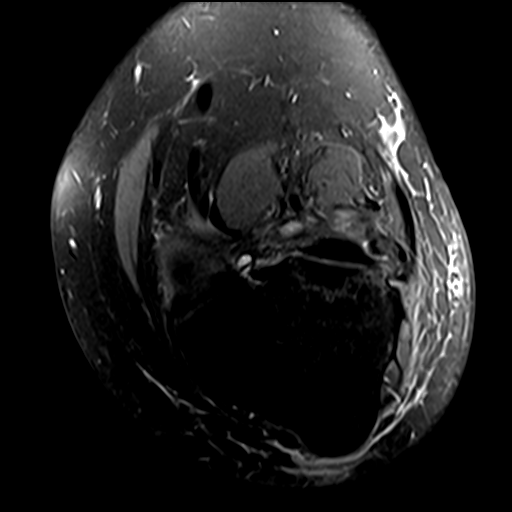
[im 7/14]
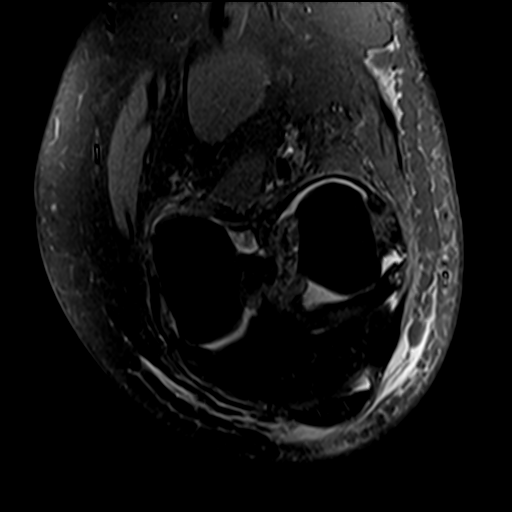
[im 14/14]
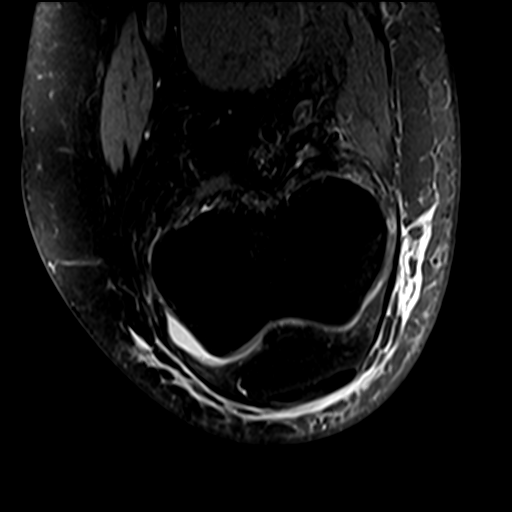

[23 of 40 positions shown; findings below may reference images not displayed]

FINDINGS: MENISCI

Medial meniscus: Peripheral inferior articular surface tear in the
midbody region.

Lateral meniscus:  Meniscal contusion but discrete meniscal tear.

LIGAMENTS

Cruciates:  Complete ACL rupture.  The PCL is intact.

Collaterals: The anterior oblique band of the fibular collateral
ligament is torn from its tibial attachment site with an associated
avulsion fracture (Segond fracture) as demonstrated on the
radiographs. The iliotibial band is intact and the major fibers of
the fibular collateral ligament intact. The MCL complex is intact.

CARTILAGE

Patellofemoral:  Normal

Medial:  Normal

Lateral:  Normal

Joint:  Large joint effusion.  Medial patellar plica is noted.

Popliteal Fossa:  No popliteal mass Baker's cyst.

Extensor Mechanism: The patella retinacular structures are intact
and quadriceps and patellar tendons are intact.

Bones: Subchondral impaction type injury involving the lateral
femoral condyle without discrete fracture. The subchondral plate is
intact.

Other: The knee musculature is.  Moderate prepatellar fluid noted.
IMPRESSION: 1. Complete ACL rupture.
2. The anterior oblique band of the fibular collateral ligament is
torn from its tibial attachment site with an associated avulsion
fracture (Segond fracture) as demonstrated on the radiographs. The
major fibers of the fibular collateral ligament are intact.
3. Subchondral impaction type injury involving the lateral tibial
plateau.
4. Peripheral inferior articular surface tear in the midbody region
of the medial meniscus.
5. Meniscal contusion involving the lateral meniscus but no discrete
meniscal tear.
6. Large joint effusion.

## 2023-11-06 ENCOUNTER — Ambulatory Visit (HOSPITAL_COMMUNITY): Admission: EM | Admit: 2023-11-06 | Discharge: 2023-11-06 | Disposition: A | Discharge status: Home / Self Care

## 2023-11-06 ENCOUNTER — Encounter (HOSPITAL_COMMUNITY): Payer: Self-pay | Admitting: Emergency Medicine

## 2023-11-06 DIAGNOSIS — N765 Ulceration of vagina: Secondary | ICD-10-CM | POA: Insufficient documentation

## 2023-11-06 MED ORDER — LIDOCAINE 7.5 % EX CREA
0.5000 g | TOPICAL_CREAM | Freq: Two times a day (BID) | CUTANEOUS | 2 refills | Status: DC | PRN
Start: 2023-11-06 — End: 2023-11-08

## 2023-11-06 NOTE — ED Provider Notes (Signed)
 [facility]  Note:  This document was prepared using Dragon voice recognition software and may include unintentional dictation errors.  MRN: [id number] DOB: 05-04-1996  Subjective:   Brittany Dixon is a 28 y.o. female presenting for external vaginal pain since Monday night.  Patient reports that she started her period on Sunday was using a menstrual cup when she removed it Monday night she felt pain and believes there may be an injury to her external vaginal surface.  Patient reports since Monday she has had an uncomfortable feeling when putting in a tampon or using menstrual pads.  Patient denies any excessive bleeding more than normal.  Patient denies any internal vaginal pain or pelvic pain, aside from normal menstrual cramping.  Patient is experiencing some dysuria secondary to vaginal injury.  No current facility-administered medications for this encounter.  Current Outpatient Medications:    Lidocaine 7.5 % CREA, Apply 0.5 g topically 2 (two) times daily as needed., Disp: 30 g, Rfl: 2   famotidine (PEPCID) 40 MG tablet, Take 40 mg by mouth 2 (two) times daily., Disp: , Rfl:    insulin aspart (NOVOLOG FLEXPEN) 100 UNIT/ML FlexPen, INJECT SUBCUTANEOUSLY AS DIRECTED UP TO MAX DAILY DOSE OF 75 UNITS **PT NEEDS FOLLOW UP APPT WITH DR Elvera Lennox**, Disp: 10 pen, Rfl: 0   Insulin Glargine (LANTUS SOLOSTAR) 100 UNIT/ML Solostar Pen, Inject 28 Units into the skin daily at 10 pm. **PT NEEDS FOLLOW UP APPT WITH DR Elvera Lennox**, Disp: 10 pen, Rfl: 0   losartan (COZAAR) 100 MG tablet, Take 100 mg by mouth daily., Disp: , Rfl:    Multiple Vitamin (MULTIVITAMIN) tablet, Take 1 tablet by mouth daily. Natures Made, Disp: , Rfl:    No Known Allergies  Past Medical History:  Diagnosis Date   Allergy    seasonal allergies   Diabetes mellitus without complication (HCC)    Hypertension    STD (sexually transmitted disease) 2018   Tx'd for Chlamydia     Past Surgical History:  Procedure  Laterality Date   COLON SURGERY     as an infant   INCISE AND DRAIN ABCESS     on abdomen, as a child    Family History  Problem Relation Age of Onset   Diabetes Mother        Type I   Lupus Mother    Allergies Father    Ovarian cancer Maternal Aunt    Hypertension Maternal Grandmother    Stroke Maternal Grandmother    Diabetes Maternal Grandfather    Glaucoma Paternal Grandfather    Colon cancer Neg Hx    Breast cancer Neg Hx    Endometrial cancer Neg Hx    Pancreatic cancer Neg Hx    Prostate cancer Neg Hx     Social History   Tobacco Use   Smoking status: Never   Smokeless tobacco: Never  Vaping Use   Vaping status: Never Used  Substance Use Topics   Alcohol use: Not Currently   Drug use: Never    ROS Refer to HPI for ROS details.  Objective:   Vitals: BP (!) 174/109 (BP Location: Left Arm)   Pulse (!) 102   Temp 98.6 F (37 C) (Oral)   Resp 15   LMP 11/02/2023   SpO2 97%   Physical Exam Vitals and nursing note reviewed. Exam conducted with a chaperone present.  Constitutional:      General: She is not in acute distress.    Appearance: She is well-developed. She  is not ill-appearing or toxic-appearing.  HENT:     Head: Normocephalic.  Cardiovascular:     Rate and Rhythm: Normal rate.  Pulmonary:     Effort: Pulmonary effort is normal. No respiratory distress.  Genitourinary:    Labia:        Right: Tenderness and lesion (Multiple vaginal lesions/ulcerations noted on exam, HSV swab collected during exam and sent to lab for further testing.) present. No rash or injury.        Left: Tenderness and lesion present. No rash or injury.      Vagina: No vaginal discharge.  Skin:    General: Skin is warm and dry.  Neurological:     General: No focal deficit present.     Mental Status: She is alert and oriented to person, place, and time.  Psychiatric:        Mood and Affect: Mood normal.     Procedures  No results found for this or any previous  visit (from the past 24 hours).  Assessment and Plan :   PDMP not reviewed this encounter.  1. Vaginal ulceration    1. Vaginal ulceration (Primary) - HSV 1/2 PCR (Surface) collected in UC and sent to lab for further testing results should be available in 1 to 2 days. - Vaginal exam completed during visit by nurse practitioner and nurse as chaperone.  Multiple vaginal ulcerations noted to external vaginal surface. - Lidocaine 7.5 % CREA; Apply 0.5 g topically 2 (two) times daily as needed for vaginal pain -Continue to monitor symptoms for any change in severity if there is any escalation of current symptoms or development of new symptoms follow-up in ER for further evaluation and management.  L[place]boy   [place], [place], [place] 11/06/23 1753

## 2023-11-06 NOTE — ED Triage Notes (Signed)
 Pt report cycle started Sunday. Tried using menstrual cup on Monday. Noticed she ripped when tried taking the cup out. Had pain and swelling since. Pt reports pain is worse when urinating. Pt reports alternating using pads and tampons.

## 2023-11-06 NOTE — Discharge Instructions (Addendum)
 1. Vaginal ulceration (Primary) - HSV 1/2 PCR (Surface) collected in UC and sent to lab for further testing results should be available in 1 to 2 days. - Vaginal exam completed during visit by nurse practitioner and nurse as chaperone.  Multiple vaginal ulcerations noted to external vaginal surface. - Lidocaine 7.5 % CREA; Apply 0.5 g topically 2 (two) times daily as needed for vaginal pain

## 2023-11-07 ENCOUNTER — Telehealth (HOSPITAL_COMMUNITY): Payer: Self-pay

## 2023-11-07 LAB — HSV 1/2 PCR (SURFACE)
HSV-1 DNA: DETECTED — AB
HSV-2 DNA: NOT DETECTED

## 2023-11-07 MED ORDER — VALACYCLOVIR HCL 1 G PO TABS: 1000.0000 mg | ORAL_TABLET | Freq: Two times a day (BID) | ORAL | 0 refills | Status: AC

## 2023-11-07 NOTE — Telephone Encounter (Signed)
 Per protocol, pt requires treatment with Valtrex.  Reviewed with patient, verified pharmacy, prescription sent.

## 2023-11-08 ENCOUNTER — Telehealth (HOSPITAL_COMMUNITY): Payer: Self-pay

## 2023-11-08 MED ORDER — LIDOCAINE VISCOUS HCL 2 % MT SOLN: OROMUCOSAL | 0 refills | Status: DC

## 2023-11-08 MED ORDER — LIDOCAINE VISCOUS HCL 2 % MT SOLN: 15.0000 mL | OROMUCOSAL | 0 refills | Status: DC | PRN

## 2023-11-08 NOTE — Telephone Encounter (Signed)
 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 Insurance will no cover this cream. Any other options. She is on Valtrex.

## 2023-11-08 NOTE — Telephone Encounter (Signed)
 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 Is there something else that can be call in? She is on valtrex. Lesion are on her vaginal and anal area.

## 2023-11-10 ENCOUNTER — Encounter: Payer: Self-pay | Admitting: Nurse Practitioner

## 2023-11-10 ENCOUNTER — Ambulatory Visit: Admitting: Nurse Practitioner

## 2023-11-10 VITALS — BP 150/102 | HR 91 | Ht 60.0 in | Wt 150.0 lb

## 2023-11-10 DIAGNOSIS — Z113 Encounter for screening for infections with a predominantly sexual mode of transmission: Secondary | ICD-10-CM | POA: Diagnosis not present

## 2023-11-10 DIAGNOSIS — A609 Anogenital herpesviral infection, unspecified: Secondary | ICD-10-CM

## 2023-11-10 DIAGNOSIS — R030 Elevated blood-pressure reading, without diagnosis of hypertension: Secondary | ICD-10-CM

## 2023-11-10 NOTE — Progress Notes (Signed)
   Acute Office Visit  Subjective:    Patient ID: Brittany Dixon, female    DOB: November 20, 1995, 28 y.o.   MRN: [id number]   HPI 28 y.o. presents today for what she thinks is a vaginal tearing from taking out menstrual cup. Reports removing cup 4/1 and pain started 4/2. Went to [facility] 11/06/23 for vulvar lesions, + HSV 1, taking Valtrex. Lidocaine gel not covered by insurance so she did not pick up. H/O HTN. Saw a provider in the past but she says she became frustrated because she kept having to go back and adjust meds, so she just stopped taking. H/O T1DM, ascites - paracentesis 03/2023.   Patient's last menstrual period was 11/02/2023 (exact date). Period Cycle (Days): 24 Period Duration (Days): 6 Period Pattern: Regular Menstrual Flow: Moderate Menstrual Control: Maxi pad, Tampon Dysmenorrhea: (!) Moderate Dysmenorrhea Symptoms: Cramping  Review of Systems  Constitutional: Negative.   Genitourinary:  Positive for genital sores.       Objective:    Physical Exam Constitutional:      Appearance: Normal appearance.  Genitourinary:    Labia:        Right: Tenderness and lesion present.        Left: Tenderness and lesion present.      Comments: Multiple, scattered vesicular lesions present    BP (!) 150/102 (BP Location: Right Arm, Patient Position: Sitting, Cuff Size: Normal)   Pulse 91   Ht 5' (1.524 m)   Wt 150 lb (68 kg)   LMP 11/02/2023 (Exact Date)   SpO2 97%   BMI 29.29 kg/m  Wt Readings from Last 3 Encounters:  11/10/23 150 lb (68 kg)  03/20/23 161 lb (73 kg)  10/29/22 151 lb (68.5 kg)        Patient informed chaperone available to be present for breast and/or pelvic exam. Patient has requested no chaperone to be present. Patient has been advised what will be completed during breast and pelvic exam.   Assessment & Plan:   Problem List Items Addressed This Visit       Other   Elevated BP without diagnosis of hypertension   Other Visit Diagnoses       HSV  (herpes simplex virus) anogenital infection    -  Primary     Screening examination for STD (sexually transmitted disease)       Relevant Orders   RPR   HIV Antibody (routine testing w rflx)   SURESWAB CT/NG/T. vaginalis      Plan: Pain from lesions. No evidence of vulvar tear. Recommend lidocaine OTC as needed, sitz baths, perineal squirt bottle during voiding, complete full course of Valtrex. STD panel pending. Discussed elevated blood pressures and importance of management. Wants to find a PCP closer to where she lives. Instructions on finding PCP provided.   Return if symptoms worsen or fail to improve.    Olivia Mackie DNP, 2:33 PM 11/10/2023

## 2023-11-10 NOTE — Addendum Note (Signed)
 Addended byWyline Beady on: 11/10/2023 04:10 PM   Modules accepted: Orders

## 2023-11-11 ENCOUNTER — Other Ambulatory Visit

## 2023-11-11 LAB — SURESWAB CT/NG/T. VAGINALIS
C. trachomatis RNA, TMA: NOT DETECTED
N. gonorrhoeae RNA, TMA: NOT DETECTED
Trichomonas vaginalis RNA: NOT DETECTED

## 2023-11-12 LAB — HIV ANTIBODY (ROUTINE TESTING W REFLEX): HIV 1&2 Ab, 4th Generation: NONREACTIVE

## 2023-11-12 LAB — RPR: RPR Ser Ql: NONREACTIVE

## 2023-11-13 ENCOUNTER — Encounter: Payer: Self-pay | Admitting: Nurse Practitioner

## 2024-03-29 ENCOUNTER — Ambulatory Visit: Admitting: Nurse Practitioner

## 2024-03-29 VITALS — BP 150/70 | HR 118 | Wt 152.4 lb

## 2024-03-29 DIAGNOSIS — R030 Elevated blood-pressure reading, without diagnosis of hypertension: Secondary | ICD-10-CM

## 2024-03-29 DIAGNOSIS — N760 Acute vaginitis: Secondary | ICD-10-CM | POA: Diagnosis not present

## 2024-03-29 DIAGNOSIS — Z113 Encounter for screening for infections with a predominantly sexual mode of transmission: Secondary | ICD-10-CM | POA: Diagnosis not present

## 2024-03-29 DIAGNOSIS — B9689 Other specified bacterial agents as the cause of diseases classified elsewhere: Secondary | ICD-10-CM

## 2024-03-29 DIAGNOSIS — N898 Other specified noninflammatory disorders of vagina: Secondary | ICD-10-CM

## 2024-03-29 LAB — WET PREP FOR TRICH, YEAST, CLUE

## 2024-03-29 MED ORDER — METRONIDAZOLE 500 MG PO TABS
500.0000 mg | ORAL_TABLET | Freq: Two times a day (BID) | ORAL | 0 refills | Status: DC
Start: 2024-03-29 — End: 2024-06-03

## 2024-03-29 NOTE — Progress Notes (Signed)
   Acute Office Visit  Subjective:    Patient ID: Brittany Dixon, female    DOB: 06-17-1996, 28 y.o.   MRN: [id number]   HPI 28 y.o. presents today for discharge and odor x 1 week. Changed laundry detergent about a month ago. Would like STD screening.   Patient's last menstrual period was 03/06/2024 (within days). Period Cycle (Days): 28 Period Duration (Days): 4-6 Period Pattern: Regular Menstrual Flow: Heavy (has clots) Menstrual Control: Tampon, Panty liner, Thin pad Dysmenorrhea: (!) Mild Dysmenorrhea Symptoms: Cramping  Review of Systems  Constitutional: Negative.   Genitourinary:  Positive for vaginal discharge.       Vaginal odor       Objective:    Physical Exam Exam conducted with a chaperone present.  Constitutional:      Appearance: Normal appearance.  Genitourinary:    General: Normal vulva.     Vagina: Vaginal discharge present. No erythema.     Cervix: Normal.     BP (!) 150/70   Pulse (!) 118   Wt 152 lb 6.4 oz (69.1 kg)   LMP 03/06/2024 (Within Days)   SpO2 92%   BMI 29.76 kg/m  Wt Readings from Last 3 Encounters:  03/29/24 152 lb 6.4 oz (69.1 kg)  11/10/23 150 lb (68 kg)  03/20/23 161 lb (73 kg)       Brittany Dixon, CMA present as chaperone.   Wet prep + clue cells (+ odor)  Assessment & Plan:   Problem List Items Addressed This Visit       Other   Elevated BP without diagnosis of hypertension   Other Visit Diagnoses       Vaginal discharge    -  Primary   Relevant Orders   WET PREP FOR TRICH, YEAST, CLUE     Screening examination for STD (sexually transmitted disease)       Relevant Orders   C. trachomatis/N. gonorrhoeae RNA     Bacterial vaginosis       Relevant Medications   metroNIDAZOLE  (FLAGYL ) 500 MG tablet      Plan: BV - Flagyl  500 mg BID x 7 days. Switch back to old detergent. GC/CT pending. Elevated blood pressure today. Has also had elevated BP at previous visits with recommendations to establish with PCP. Patient  shown how to establish with new provider today and discussed importance of blood pressure management.   Return if symptoms worsen or fail to improve.    [name] DNP, 2:28 PM 03/29/2024

## 2024-03-30 LAB — C. TRACHOMATIS/N. GONORRHOEAE RNA
C. trachomatis RNA, TMA: NOT DETECTED
N. gonorrhoeae RNA, TMA: NOT DETECTED

## 2024-03-31 ENCOUNTER — Ambulatory Visit: Payer: Self-pay | Admitting: Nurse Practitioner

## 2024-06-03 ENCOUNTER — Encounter: Payer: Self-pay | Admitting: Nurse Practitioner

## 2024-06-03 ENCOUNTER — Ambulatory Visit: Admitting: Nurse Practitioner

## 2024-06-03 VITALS — BP 120/74 | HR 115 | Ht 60.0 in | Wt 152.0 lb

## 2024-06-03 DIAGNOSIS — Z1331 Encounter for screening for depression: Secondary | ICD-10-CM | POA: Diagnosis not present

## 2024-06-03 DIAGNOSIS — Z01419 Encounter for gynecological examination (general) (routine) without abnormal findings: Secondary | ICD-10-CM | POA: Diagnosis not present

## 2024-06-03 DIAGNOSIS — Z113 Encounter for screening for infections with a predominantly sexual mode of transmission: Secondary | ICD-10-CM

## 2024-06-03 NOTE — Progress Notes (Signed)
   Brittany Dixon [id number]   History:  28 y.o. G0 presents for annual exam. Monthly cycles. Normal pap history. T1DM managed by endo. Has been evaluated by GI, gyn onc and general surgery for peritoneal cysts, unknown cause. Patient can feel them and sometimes has mild pain. Would like STD screening.   Gynecologic History Patient's last menstrual period was 06/01/2024 (exact date). Period Cycle (Days): 28 Period Duration (Days): 6 Period Pattern: Regular Menstrual Flow: Heavy, Moderate Menstrual Control: Tampon, Maxi pad Dysmenorrhea: (!) Mild Dysmenorrhea Symptoms: Cramping Contraception/Family planning: condoms Sexually active: Yes  Health Maintenance Last Pap: 03/20/2023. Results were: Normal Last mammogram: Not indicated Last colonoscopy: UNK Last Dexa: Not indicated      06/03/2024    3:36 PM  Depression screen PHQ 2/9  Decreased Interest 0  Down, Depressed, Hopeless 0  PHQ - 2 Score 0     Past medical history, past surgical history, family history and social history were all reviewed and documented in the EPIC chart. 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   ROS:  A ROS was performed and pertinent positives and negatives are included.  Exam:  Vitals:   06/03/24 1534  BP: 120/74  Pulse: (!) 115  SpO2: 98%  Weight: 152 lb (68.9 kg)  Height: 5' (1.524 m)   Body mass index is 29.69 kg/m.  General appearance:  Normal Thyroid:  Symmetrical, normal in size, without palpable masses or nodularity. Respiratory  Auscultation:  Clear without wheezing or rhonchi Cardiovascular  Auscultation:  Regular rate, without rubs, murmurs or gallops  Edema/varicosities:  Not grossly evident Abdominal  Soft,nontender, without masses, guarding or rebound.  Liver/spleen:  No organomegaly noted  Hernia:  None appreciated  Skin  Inspection:  Grossly normal Breasts: Examined lying and sitting.   Right: Without masses, retractions, nipple discharge or axillary  adenopathy.   Left: Without masses, retractions, nipple discharge or axillary adenopathy. Pelvic: External genitalia:  no lesions              Urethra:  normal appearing urethra with no masses, tenderness or lesions              Bartholins and Skenes: normal                 Vagina: normal appearing vagina with normal color and discharge, no lesions              Cervix: no lesions Bimanual Exam:  Uterus:  no masses or tenderness              Adnexa: no mass, fullness, tenderness              Rectovaginal: Deferred              Anus:  normal, no lesions  Brittany Dixon, CMA present as chaperone.   Assessment/Plan:  28 y.o. G0 for annual exam.   Well female exam with routine gynecological exam - Education provided on SBEs, importance of preventative screenings, current guidelines, high calcium diet, regular exercise, and multivitamin daily. Labs with PCP and endo.   Screening examination for STD (sexually transmitted disease) - Plan: RPR, HIV Antibody (routine testing w rflx), SURESWAB CT/NG/T. vaginalis  Screening for cervical cancer - Normal Pap history.  Will repeat at 3-year interval per guidelines.   Return in about 1 year (around 06/03/2025) for Annual.   Brittany DELENA Shutter DNP, 3:45 PM 06/03/2024

## 2024-06-04 LAB — RPR: RPR Ser Ql: NONREACTIVE

## 2024-06-04 LAB — HIV ANTIBODY (ROUTINE TESTING W REFLEX)
HIV 1&2 Ab, 4th Generation: NONREACTIVE
HIV FINAL INTERPRETATION: NEGATIVE

## 2024-06-04 LAB — SURESWAB CT/NG/T. VAGINALIS
C. trachomatis RNA, TMA: NOT DETECTED
N. gonorrhoeae RNA, TMA: NOT DETECTED
Trichomonas vaginalis RNA: NOT DETECTED

## 2024-06-07 ENCOUNTER — Ambulatory Visit: Payer: Self-pay | Admitting: Nurse Practitioner
